# Patient Record
Sex: Female | Born: 1990
Health system: Southern US, Community
[De-identification: ages and names within clinical notes are randomized; demographics above are authoritative.]

## PROBLEM LIST (undated history)

## (undated) DIAGNOSIS — A749 Chlamydial infection, unspecified: Secondary | ICD-10-CM

## (undated) DIAGNOSIS — N390 Urinary tract infection, site not specified: Secondary | ICD-10-CM

## (undated) DIAGNOSIS — Z114 Encounter for screening for human immunodeficiency virus [HIV]: Secondary | ICD-10-CM

## (undated) DIAGNOSIS — Z22330 Carrier of Group B streptococcus: Secondary | ICD-10-CM

## (undated) HISTORY — DX: Encounter for screening for human immunodeficiency virus (HIV): Z11.4

---
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
## 2008-06-26 ENCOUNTER — Encounter: Admission: RE | Admit: 2008-06-26 | Discharge: 2008-06-26 | Payer: Self-pay | Admitting: Surgery

## 2009-06-18 ENCOUNTER — Emergency Department (HOSPITAL_COMMUNITY): Admission: EM | Admit: 2009-06-18 | Discharge: 2009-06-18 | Payer: Self-pay | Admitting: Family Medicine

## 2010-05-13 ENCOUNTER — Emergency Department (HOSPITAL_COMMUNITY): Admission: EM | Admit: 2010-05-13 | Discharge: 2010-05-13 | Payer: Self-pay | Admitting: Family Medicine

## 2010-05-28 ENCOUNTER — Emergency Department (HOSPITAL_COMMUNITY): Admission: EM | Admit: 2010-05-28 | Discharge: 2010-05-29 | Payer: Self-pay | Admitting: Emergency Medicine

## 2010-05-29 ENCOUNTER — Emergency Department (HOSPITAL_COMMUNITY): Admission: EM | Admit: 2010-05-29 | Discharge: 2010-05-29 | Payer: Self-pay | Admitting: Emergency Medicine

## 2010-08-23 ENCOUNTER — Emergency Department (HOSPITAL_COMMUNITY)
Admission: EM | Admit: 2010-08-23 | Discharge: 2010-08-23 | Payer: Self-pay | Source: Home / Self Care | Admitting: Emergency Medicine

## 2010-10-05 NOTE — L&D Delivery Note (Signed)
Delivery Note  After moved to hands and knees, pt had stronger urge to push, pushing involuntarily, turned back to [facility] and pushed well.   At 8:24 PM a viable female was delivered via Vaginal, Spontaneous Delivery (Presentation: Right Occiput Anterior). Compound presentation of R arm  APGAR: 9, 9; weight 7 lb 7.4 oz (3385 g).   Placenta status: Intact, Spontaneous.  Cord: 3 vessels with the following complications: None.    Anesthesia: 2% lidocaine, local infiltration Episiotomy: None Lacerations: 1st degree;Labial;Perineal Suture Repair: 3.0 vicryl rapide Est. Blood Loss (mL): 250  Mom to postpartum.  Baby to nursery-stable. Pt desires IP circ Dr. Normand Sloop updated  Malissa Hippo 08/23/2011, 9:18 PM

## 2010-10-27 ENCOUNTER — Encounter: Payer: Self-pay | Admitting: Surgery

## 2010-12-19 LAB — POCT URINALYSIS DIPSTICK
Glucose, UA: NEGATIVE mg/dL
Protein, ur: 30 mg/dL — AB
Urobilinogen, UA: 0.2 mg/dL (ref 0.0–1.0)

## 2010-12-19 LAB — POCT PREGNANCY, URINE: Preg Test, Ur: NEGATIVE

## 2010-12-19 LAB — URINE CULTURE: Colony Count: 85000

## 2011-03-30 LAB — CBC: Platelets: 196 10*3/uL (ref 150–399)

## 2011-03-30 LAB — ABO/RH

## 2011-03-30 LAB — HIV ANTIBODY (ROUTINE TESTING W REFLEX): HIV: NONREACTIVE

## 2011-03-30 LAB — RPR: RPR: NONREACTIVE

## 2011-04-17 ENCOUNTER — Institutional Professional Consult (permissible substitution): Payer: Self-pay | Admitting: Pulmonary Disease

## 2011-04-20 ENCOUNTER — Institutional Professional Consult (permissible substitution): Payer: Self-pay | Admitting: Pulmonary Disease

## 2011-06-09 LAB — GC/CHLAMYDIA PROBE AMP, GENITAL
Chlamydia: POSITIVE
Gonorrhea: NEGATIVE

## 2011-06-09 LAB — CBC
HCT: 36 % (ref 36–46)
Hemoglobin: 11.7 g/dL — AB (ref 12.0–16.0)

## 2011-07-27 ENCOUNTER — Inpatient Hospital Stay (HOSPITAL_COMMUNITY)
Admission: AD | Admit: 2011-07-27 | Discharge: 2011-07-27 | Disposition: A | Discharge status: Home / Self Care | Payer: PRIVATE HEALTH INSURANCE | Source: Ambulatory Visit | Attending: Obstetrics and Gynecology | Admitting: Obstetrics and Gynecology

## 2011-07-27 ENCOUNTER — Encounter (HOSPITAL_COMMUNITY): Payer: Self-pay | Admitting: *Deleted

## 2011-07-27 DIAGNOSIS — O99891 Other specified diseases and conditions complicating pregnancy: Secondary | ICD-10-CM | POA: Insufficient documentation

## 2011-07-27 HISTORY — DX: Chlamydial infection, unspecified: A74.9

## 2011-07-27 LAB — URINALYSIS, ROUTINE W REFLEX MICROSCOPIC
Bilirubin Urine: NEGATIVE
Leukocytes, UA: NEGATIVE
Protein, ur: NEGATIVE mg/dL
Specific Gravity, Urine: 1.015 (ref 1.005–1.030)
Urobilinogen, UA: 0.2 mg/dL (ref 0.0–1.0)

## 2011-07-27 LAB — AMNISURE RUPTURE OF MEMBRANE (ROM) NOT AT ARMC: Amnisure ROM: NEGATIVE

## 2011-07-27 NOTE — Progress Notes (Signed)
Gush around 1700 on Sunday, "hot" fluid, clear.  Still having a small amt coming. No bleeding.  Had some pain after gush.  Small little ctx's today.

## 2011-07-27 NOTE — ED Provider Notes (Signed)
History     Chief Complaint  Patient presents with  . Labor Eval   HPI Comments: Pt is a G1P0 at 35wks w c/o leaking fluid since last evening, has not had much leaking today. Denies ctx, VB, +FM.       Past Medical History  Diagnosis Date  . Asthma   . Chlamydia     Past Surgical History  Procedure Date  . Breast lumpectomy     Family History  Problem Relation Age of Onset  . Cancer Mother   . Heart disease Mother   . Hypertension Mother     History  Substance Use Topics  . Smoking status: Never Smoker   . Smokeless tobacco: Not on file  . Alcohol Use: No    Allergies: No Known Allergies  Prescriptions prior to admission  Medication Sig Dispense Refill  . albuterol (PROVENTIL HFA;VENTOLIN HFA) 108 (90 BASE) MCG/ACT inhaler Inhale 2 puffs into the lungs daily as needed. For asthma         Review of Systems  All other systems reviewed and are negative.   Physical Exam   Blood pressure 89/54, pulse 69, temperature 98.7 F (37.1 C), temperature source Oral, resp. rate 18, height 5\' 5"  (1.651 m), weight 67.132 kg (148 lb).  Physical Exam  Nursing note and vitals reviewed. Constitutional: She is oriented to person, place, and time. She appears well-developed and well-nourished.  Cardiovascular: Normal rate.   Respiratory: Effort normal.  GI: Soft.  Genitourinary: Vagina normal.       Cx=ft/th/high Scant discharge in vault amnisure collected  Neurological: She is alert and oriented to person, place, and time.  Skin: Skin is warm.  Psychiatric: She has a normal mood and affect. Her behavior is normal.   FHR 130 CAT I toco - occ uc with irritability  MAU Course  Procedures    Assessment and Plan  IUP at 35wks FHR reassuring  amnisure pending  Kamaiyah Uselton M 07/27/2011, 2:02 PM   @1425  - amnisure negative Pt d/c home  Has f/u on 10-24

## 2011-08-22 ENCOUNTER — Encounter (HOSPITAL_COMMUNITY): Payer: Self-pay | Admitting: *Deleted

## 2011-08-22 ENCOUNTER — Inpatient Hospital Stay (HOSPITAL_COMMUNITY)
Admission: AD | Admit: 2011-08-22 | Discharge: 2011-08-22 | Disposition: A | Discharge status: Home / Self Care | Payer: Medicaid Other | Source: Ambulatory Visit | Attending: Obstetrics and Gynecology | Admitting: Obstetrics and Gynecology

## 2011-08-22 DIAGNOSIS — O471 False labor at or after 37 completed weeks of gestation: Secondary | ICD-10-CM

## 2011-08-22 DIAGNOSIS — O479 False labor, unspecified: Secondary | ICD-10-CM | POA: Insufficient documentation

## 2011-08-22 HISTORY — DX: Urinary tract infection, site not specified: N39.0

## 2011-08-22 NOTE — ED Notes (Signed)
efm strip was reviewed by Elsie Ra CNM reactive

## 2011-08-22 NOTE — ED Provider Notes (Signed)
History     Chief Complaint  Patient presents with  . Contractions   HPI Pt presents with complaint of uterine contractions throughout the night which she perceives to have increased in intensity and frequency over the past several hours.  She denies ROM or bldg.  She reports her fetus is moving normally.   OB History    Grav Para Term Preterm Abortions TAB SAB Ect Mult Living   1               Past Medical History  Diagnosis Date  . Asthma   . Chlamydia   . Urinary tract infection, recurrent     Past Surgical History  Procedure Date  . Breast lumpectomy     Family History  Problem Relation Age of Onset  . Cancer Mother   . Heart disease Mother   . Hypertension Mother   . Mental illness Mother   . Hypertension Father   . Hypertension Maternal Aunt   . Hypertension Maternal Uncle   . Hypertension Maternal Grandmother   . Kidney disease Maternal Grandmother   . COPD Maternal Grandmother   . Cancer Maternal Grandmother   . Hypertension Maternal Grandfather     History  Substance Use Topics  . Smoking status: Never Smoker   . Smokeless tobacco: Not on file  . Alcohol Use: No    Allergies: No Known Allergies  Prescriptions prior to admission  Medication Sig Dispense Refill  . albuterol (PROVENTIL HFA;VENTOLIN HFA) 108 (90 BASE) MCG/ACT inhaler Inhale 2 puffs into the lungs daily as needed. For asthma        Review of Systems  Constitutional: Negative.   HENT: Negative.   Eyes: Negative.   Respiratory: Negative.   Cardiovascular: Negative.   Gastrointestinal: Negative.   Genitourinary: Negative.   Musculoskeletal: Negative.   Skin: Negative.   Neurological: Negative.   Endo/Heme/Allergies: Negative.   Psychiatric/Behavioral: Negative.    Physical Exam   Blood pressure 113/66, pulse 77, temperature 98.1 F (36.7 C), temperature source Oral, resp. rate 18.  Physical Exam  Constitutional: She is oriented to person, place, and time. She appears  well-developed and well-nourished.  HENT:  Head: Normocephalic and atraumatic.  Right Ear: External ear normal.  Left Ear: External ear normal.  Nose: Nose normal.  Eyes: Pupils are equal, round, and reactive to light.  Neck: Normal range of motion. Neck supple. No thyromegaly present.  Cardiovascular: Normal rate, regular rhythm and intact distal pulses.   Respiratory: Effort normal and breath sounds normal.  GI: Soft. Bowel sounds are normal.  Genitourinary: Vagina normal and uterus normal.  Musculoskeletal: Normal range of motion.  Neurological: She is alert and oriented to person, place, and time. She has normal reflexes.  Skin: Skin is warm and dry.  Psychiatric: She has a normal mood and affect. Her behavior is normal. Judgment and thought content normal.   SVE:  Dilation 1.5cm Effacement:  80% Station: -1 Presentation:  Vertex  FHR baseline 130 with moderate variability present.  No decels present.  Accels present.   UC's every 4-7 minutes with uterine irritability present.  MAU Course  Procedures   Assessment and Plan  IUP at 38w 5d Contractions  Pt to ambulate x 1hr and then reevaluate SVE.  Kenlie Seki O. 08/22/2011, 8:01 AM   08/22/11, 9:37 AM Pt returned from walking and reports since returning to MAU her contractions are much less frequent.    FHR baseline 130 with minimal to moderate variability.  No  decels present.  Accels present.   UCs every 7-11 minutes   SVE - unchanged from previous.  A:  False labor at 38w 5 days  P:  Discharge to home.  RTO as previously scheduled for ROB.  Revd signs/symptoms of labor.  Elsie Ra, CNM

## 2011-08-22 NOTE — Progress Notes (Signed)
Been having since Friday at 7p.  Contractions got worse and closer together

## 2011-08-23 ENCOUNTER — Encounter (HOSPITAL_COMMUNITY): Payer: Self-pay | Admitting: *Deleted

## 2011-08-23 ENCOUNTER — Inpatient Hospital Stay (HOSPITAL_COMMUNITY)
Admission: AD | Admit: 2011-08-23 | Discharge: 2011-08-25 | DRG: 775 | Disposition: A | Discharge status: Home / Self Care | Payer: Medicaid Other | Source: Ambulatory Visit | Attending: Obstetrics and Gynecology | Admitting: Obstetrics and Gynecology

## 2011-08-23 DIAGNOSIS — J4540 Moderate persistent asthma, uncomplicated: Secondary | ICD-10-CM | POA: Diagnosis present

## 2011-08-23 DIAGNOSIS — Z22330 Carrier of Group B streptococcus: Secondary | ICD-10-CM

## 2011-08-23 DIAGNOSIS — Z3490 Encounter for supervision of normal pregnancy, unspecified, unspecified trimester: Secondary | ICD-10-CM

## 2011-08-23 DIAGNOSIS — O328XX0 Maternal care for other malpresentation of fetus, not applicable or unspecified: Secondary | ICD-10-CM | POA: Diagnosis present

## 2011-08-23 DIAGNOSIS — A749 Chlamydial infection, unspecified: Secondary | ICD-10-CM | POA: Diagnosis not present

## 2011-08-23 DIAGNOSIS — O99892 Other specified diseases and conditions complicating childbirth: Secondary | ICD-10-CM | POA: Diagnosis present

## 2011-08-23 HISTORY — DX: Carrier of group B Streptococcus: Z22.330

## 2011-08-23 LAB — CBC
HCT: 39.4 % (ref 36.0–46.0)
Hemoglobin: 13.3 g/dL (ref 12.0–15.0)
WBC: 7.8 10*3/uL (ref 4.0–10.5)

## 2011-08-23 MED ORDER — ONDANSETRON HCL 4 MG/2ML IJ SOLN: 4.0000 mg | INTRAMUSCULAR | Status: DC | PRN

## 2011-08-23 MED ORDER — BENZOCAINE-MENTHOL 20-0.5 % EX AERO: 1.0000 "application " | INHALATION_SPRAY | CUTANEOUS | Status: DC | PRN

## 2011-08-23 MED ORDER — PENICILLIN G POTASSIUM 5000000 UNITS IJ SOLR
5.0000 10*6.[IU] | Freq: Once | INTRAVENOUS | Status: AC
  Administered 2011-08-23: 5 10*6.[IU] via INTRAVENOUS
  Filled 2011-08-23: qty 5

## 2011-08-23 MED ORDER — ACETAMINOPHEN 325 MG PO TABS: 650.0000 mg | ORAL_TABLET | ORAL | Status: DC | PRN

## 2011-08-23 MED ORDER — SIMETHICONE 80 MG PO CHEW: 80.0000 mg | CHEWABLE_TABLET | ORAL | Status: DC | PRN

## 2011-08-23 MED ORDER — LANOLIN HYDROUS EX OINT: TOPICAL_OINTMENT | CUTANEOUS | Status: DC | PRN

## 2011-08-23 MED ORDER — ZOLPIDEM TARTRATE 5 MG PO TABS: 5.0000 mg | ORAL_TABLET | Freq: Every evening | ORAL | Status: DC | PRN

## 2011-08-23 MED ORDER — LIDOCAINE HCL (PF) 1 % IJ SOLN
30.0000 mL | INTRAMUSCULAR | Status: DC | PRN
  Administered 2011-08-23: 30 mL via SUBCUTANEOUS
  Filled 2011-08-23: qty 30

## 2011-08-23 MED ORDER — PRENATAL PLUS 27-1 MG PO TABS
1.0000 | ORAL_TABLET | Freq: Every day | ORAL | Status: DC
  Administered 2011-08-24 – 2011-08-25 (×2): 1 via ORAL
  Filled 2011-08-23 (×2): qty 1

## 2011-08-23 MED ORDER — OXYTOCIN 10 UNIT/ML IJ SOLN: 10.0000 [IU] | Freq: Once | INTRAMUSCULAR | Status: DC

## 2011-08-23 MED ORDER — TETANUS-DIPHTH-ACELL PERTUSSIS 5-2.5-18.5 LF-MCG/0.5 IM SUSP
0.5000 mL | Freq: Once | INTRAMUSCULAR | Status: AC
  Administered 2011-08-24: 0.5 mL via INTRAMUSCULAR
  Filled 2011-08-23: qty 0.5

## 2011-08-23 MED ORDER — OXYTOCIN 20 UNITS IN LACTATED RINGERS INFUSION - SIMPLE
125.0000 mL/h | Freq: Once | INTRAVENOUS | Status: AC
  Administered 2011-08-23: 125 mL/h via INTRAVENOUS

## 2011-08-23 MED ORDER — BENZOCAINE-MENTHOL 20-0.5 % EX AERO
INHALATION_SPRAY | CUTANEOUS | Status: AC
  Filled 2011-08-23: qty 56

## 2011-08-23 MED ORDER — IBUPROFEN 600 MG PO TABS
600.0000 mg | ORAL_TABLET | Freq: Four times a day (QID) | ORAL | Status: DC
  Administered 2011-08-24 – 2011-08-25 (×5): 600 mg via ORAL
  Filled 2011-08-23 (×6): qty 1

## 2011-08-23 MED ORDER — SENNOSIDES-DOCUSATE SODIUM 8.6-50 MG PO TABS
2.0000 | ORAL_TABLET | Freq: Every day | ORAL | Status: DC
  Administered 2011-08-24: 2 via ORAL

## 2011-08-23 MED ORDER — ONDANSETRON HCL 4 MG PO TABS: 4.0000 mg | ORAL_TABLET | ORAL | Status: DC | PRN

## 2011-08-23 MED ORDER — LACTATED RINGERS IV SOLN
INTRAVENOUS | Status: DC
  Administered 2011-08-23: 125 mL/h via INTRAVENOUS

## 2011-08-23 MED ORDER — DIBUCAINE 1 % RE OINT: 1.0000 "application " | TOPICAL_OINTMENT | RECTAL | Status: DC | PRN

## 2011-08-23 MED ORDER — OXYTOCIN BOLUS FROM INFUSION
500.0000 mL | Freq: Once | INTRAVENOUS | Status: DC
  Filled 2011-08-23: qty 1000
  Filled 2011-08-23: qty 500

## 2011-08-23 MED ORDER — OXYCODONE-ACETAMINOPHEN 5-325 MG PO TABS: 1.0000 | ORAL_TABLET | ORAL | Status: DC | PRN

## 2011-08-23 MED ORDER — BUTORPHANOL TARTRATE 2 MG/ML IJ SOLN
INTRAMUSCULAR | Status: AC
  Administered 2011-08-23: 2 mg via INTRAVENOUS
  Filled 2011-08-23: qty 1

## 2011-08-23 MED ORDER — IBUPROFEN 600 MG PO TABS: 600.0000 mg | ORAL_TABLET | Freq: Four times a day (QID) | ORAL | Status: DC | PRN

## 2011-08-23 MED ORDER — OXYCODONE-ACETAMINOPHEN 5-325 MG PO TABS: 2.0000 | ORAL_TABLET | ORAL | Status: DC | PRN

## 2011-08-23 MED ORDER — BUTORPHANOL TARTRATE 2 MG/ML IJ SOLN
2.0000 mg | INTRAMUSCULAR | Status: DC | PRN
  Administered 2011-08-23: 2 mg via INTRAVENOUS

## 2011-08-23 MED ORDER — LACTATED RINGERS IV SOLN: 500.0000 mL | INTRAVENOUS | Status: DC | PRN

## 2011-08-23 MED ORDER — CITRIC ACID-SODIUM CITRATE 334-500 MG/5ML PO SOLN: 30.0000 mL | ORAL | Status: DC | PRN

## 2011-08-23 MED ORDER — ONDANSETRON HCL 4 MG/2ML IJ SOLN: 4.0000 mg | Freq: Four times a day (QID) | INTRAMUSCULAR | Status: DC | PRN

## 2011-08-23 MED ORDER — WITCH HAZEL-GLYCERIN EX PADS: 1.0000 "application " | MEDICATED_PAD | CUTANEOUS | Status: DC | PRN

## 2011-08-23 MED ORDER — DIPHENHYDRAMINE HCL 25 MG PO CAPS: 25.0000 mg | ORAL_CAPSULE | Freq: Four times a day (QID) | ORAL | Status: DC | PRN

## 2011-08-23 MED ORDER — PENICILLIN G POTASSIUM 5000000 UNITS IJ SOLR
2.5000 10*6.[IU] | INTRAVENOUS | Status: DC
  Administered 2011-08-23 (×2): 2.5 10*6.[IU] via INTRAVENOUS
  Filled 2011-08-23 (×4): qty 2.5

## 2011-08-23 NOTE — Progress Notes (Signed)
.  Subjective: Contractions stronger, requests IV mediation    Objective: BP 113/54  Pulse 89  Temp(Src) 98.9 F (37.2 C) (Oral)  Resp 18  Ht 5\' 5"  (1.651 m)  Wt 68.04 kg (150 lb)  BMI 24.96 kg/m2   FHT:  145 UC:   regular,  SVE:   Dilation: 8 Effacement (%): 100 Station: +1 Exam by:: S.Celine Dishman,CNM    Assessment / Plan: Spontaneous labor, progressing normally GBS +   Fetal Wellbeing:  reassuring Pain Control:  will give stadol after EFM strip  Update Dr. Normand Sloop per telephone on vag, arm. Discussed pain management options IV vs epidural, requests IV meds.  Maytal Mijangos M 08/23/2011, 5:58 PM

## 2011-08-23 NOTE — H&P (Signed)
Terri Howard is a 20 y.o. female presenting for onset of ctx since last night, stronger this morning. Some bloody show, no LOF +FM. Pregnancy significant for: 1. +GBS 2. Hx CT - just finished zithromax last week 3. Hx UTI 4. Strong FH HTN  Maternal Medical History:  Reason for admission: Reason for admission: contractions.  Contractions: Onset was 6-12 hours ago.   Frequency: regular.   Perceived severity is moderate.    Fetal activity: Perceived fetal activity is normal.   Last perceived fetal movement was within the past hour.    Prenatal complications: no prenatal complications   OB History    Grav Para Term Preterm Abortions TAB SAB Ect Mult Living   1              Past Medical History  Diagnosis Date  . Asthma   . Chlamydia   . Urinary tract infection, recurrent    Past Surgical History  Procedure Date  . Breast lumpectomy    Family History: family history includes COPD in her maternal grandmother; Cancer in her maternal grandmother and mother; Heart disease in her mother; Hypertension in her father, maternal aunt, maternal grandfather, maternal grandmother, maternal uncle, and mother; Kidney disease in her maternal grandmother; and Mental illness in her mother. Social History:  reports that she has never smoked. She does not have any smokeless tobacco history on file. She reports that she does not drink alcohol or use illicit drugs.  Pt is a single AA, is a Company secretary  Review of Systems  All other systems reviewed and are negative.    Dilation: 5 Effacement (%): 100 Station: +1 Exam by:: [email or web address] Temperature 98.3 F (36.8 C), temperature source Oral, resp. rate 18, height 5\' 5"  (1.651 Howard), weight 68.04 kg (150 lb). Maternal Exam:  Uterine Assessment: Contraction strength is moderate.  Contraction frequency is regular.   Abdomen: Fundal height is aga.   Estimated fetal weight is 7#.   Fetal presentation: vertex  Introitus: Normal vulva.  Normal vagina.  Ferning test: not done.   Pelvis: adequate for delivery.   Cervix: Cervix evaluated by digital exam.   cx 5cm/100/0 per RN  Fetal Exam Fetal Monitor Review: Mode: ultrasound.   Baseline rate: 140.  Variability: moderate (6-25 bpm).   Pattern: no accelerations.    Fetal State Assessment: Category I - tracings are normal.     Physical Exam  Nursing note and vitals reviewed. Constitutional: She is oriented to person, place, and time. She appears well-developed and well-nourished.  HENT:  Head: Normocephalic.  Neck: Normal range of motion.  Cardiovascular: Normal rate, regular rhythm and normal heart sounds.   Respiratory: Effort normal and breath sounds normal.  GI: There is no tenderness.  Genitourinary: Vagina normal.  Musculoskeletal: Normal range of motion.  Neurological: She is alert and oriented to person, place, and time.  Skin: Skin is warm and dry.  Psychiatric: She has a normal mood and affect. Her behavior is normal.    Prenatal labs: ABO, Rh: O/Positive/-- (06/25 0000) Antibody: Negative (06/25 0000) Rubella:   IMM      RPR: Nonreactive (06/25 0000)  HBsAg:   neg HIV: Non-reactive (06/25 0000)  GBS: Positive (09/04 0000)  QUAD screen neg Hgb electro - neg 1hr GTT normal   Assessment/Plan: IUP at [redacted]w[redacted]d +GBS FHR reassuring, but not reactive Active labor  Admit to birthing suites, CNM care, Dr Normand Sloop attending PCN for GBS prophylaxis Declines pain meds PRN Expectant mgmnt  Terri Howard 08/23/2011, 11:56 AM

## 2011-08-23 NOTE — Progress Notes (Signed)
.  Subjective: Back and pelvic pressure with contractions plans to walk    Objective: BP 111/59  Pulse 92  Temp(Src) 98.3 F (36.8 C) (Oral)  Resp 18  Ht 5\' 5"  (1.651 m)  Wt 68.04 kg (150 lb)  BMI 24.96 kg/m2   FHT:  FHR: 140s intermittentlySVE:  ctxn mod, regular abd soft between uc Dilation: 7 Effacement (%): 100 Station: +1 Exam by:: Almond Lint CNM    Assessment / Plan: Spontaneous labor, progressing normally GBS    Fetal Wellbeing:  reassuring Pain Control:  ambulating  Update physician PRN  Malissa Hippo 08/23/2011, 2:34 PM

## 2011-08-24 LAB — CBC
Hemoglobin: 12.2 g/dL (ref 12.0–15.0)
MCH: 29.5 pg (ref 26.0–34.0)
MCHC: 33.1 g/dL (ref 30.0–36.0)
MCV: 89.1 fL (ref 78.0–100.0)
RBC: 4.14 MIL/uL (ref 3.87–5.11)

## 2011-08-24 NOTE — Progress Notes (Addendum)
Post Partum Day 1  Subjective: up ad lib, voiding, tolerating PO, + flatus and reports some dizziness when up today.  Working on breastfeeding; Desires outpatient circ.  VB lightening.    Objective: Blood pressure 92/54, pulse 72, temperature 98.5 F (36.9 C), temperature source Oral, resp. rate 20, height 5\' 5"  (1.651 m), weight 68.04 kg (150 lb), SpO2 96.00%, unknown if currently breastfeeding.  Physical Exam:  General: alert, cooperative and no distress Lochia: appropriate Uterine Fundus: firm, below umbilicus Incision: n/a DVT Evaluation: No evidence of DVT seen on physical exam. Negative Homan's sign. No significant calf/ankle edema.   Basename 08/24/11 0515 08/23/11 1130  HGB 12.2 13.3  HCT 36.9 39.4    Assessment/Plan: Plan for discharge tomorrow, Breastfeeding and Lactation consult Will do orthostatic VS secondary to dizziness.  LOS: 1 day   STEELMAN,CANDICE H 08/24/2011, 3:05 PM    Agree with Above - AYR

## 2011-08-24 NOTE — Progress Notes (Signed)
UR chart review completed.  

## 2011-08-25 NOTE — Progress Notes (Signed)
Post Partum Day 2 Subjective: no complaints, up ad lib, voiding, tolerating PO, + flatus and breastfeeding going well, leaking.  Plans outpatient circ.  Undecided on [facility].  No BM yet.  VB tapering. Pt's mother at [facility] this AM. Minimal pain to report.  Objective: Blood pressure 92/46, pulse 74, temperature 98.2 F (36.8 C), temperature source Oral, resp. rate 18, height 5\' 5"  (1.651 m), weight 68.04 kg (150 lb), SpO2 96.00%, unknown if currently breastfeeding.  Physical Exam:  General: alert, cooperative and no distress Lochia: appropriate Uterine Fundus: below umbilicus Incision: n/a DVT Evaluation: No evidence of DVT seen on physical exam. Negative Homan's sign. No edema   Basename 08/24/11 0515 08/23/11 1130  HGB 12.2 13.3  HCT 36.9 39.4    Assessment/Plan: Discharge home, Breastfeeding and Contraception Undecided--abstinence recommended until 6 wk appt. D/c instructions per [facility] pamphlet; warning s/s to report rev'd; continue PNV; Rx given for motrin.   LOS: 2 days   Terri Howard H 08/25/2011, 10:32 AM

## 2011-08-25 NOTE — Discharge Summary (Signed)
Obstetric Discharge Summary Reason for Admission: onset of labor Prenatal Procedures: ultrasound Intrapartum Procedures: spontaneous vaginal delivery and GBS prophylaxis Postpartum Procedures: none Complications-Operative and Postpartum: 1st  degree perineal laceration & labial Hemoglobin  Date Value Range Status  08/24/2011 12.2  12.0-15.0 (g/dL) Final     HCT  Date Value Range Status  08/24/2011 36.9  36.0-46.0 (%) Final    Discharge Diagnoses: Term Pregnancy-delivered and lactating; asthma-stable; h/o recurrent UTI's; h/o STI's--chlamydia positive late 3rd trimester;  Discharge Information: Date: 08/25/2011 Activity: pelvic rest Diet: routine Medications: PNV, Ibuprofen and Colace Condition: stable Instructions: refer to practice specific booklet Discharge to: home Follow-up Information    Follow up with [facility] in 6 weeks. (call to schedule your appointment, or  as needed)          Newborn Data: Live born female "Demetris" Sanda Klein, [place] delivery provider) Birth Weight: 7 lb 7.4 oz (3385 g) APGAR: 9, 9  Home with mother.  Aasim Restivo H 08/25/2011, 10:29 AM

## 2012-07-29 ENCOUNTER — Encounter (HOSPITAL_COMMUNITY): Payer: Self-pay | Admitting: Emergency Medicine

## 2012-07-29 ENCOUNTER — Emergency Department (HOSPITAL_COMMUNITY)
Admission: EM | Admit: 2012-07-29 | Discharge: 2012-07-29 | Disposition: A | Discharge status: Home / Self Care | Payer: BC Managed Care – PPO | Source: Home / Self Care | Attending: Family Medicine | Admitting: Family Medicine

## 2012-07-29 ENCOUNTER — Emergency Department (HOSPITAL_COMMUNITY)
Admission: EM | Admit: 2012-07-29 | Discharge: 2012-07-29 | Disposition: A | Discharge status: Home / Self Care | Payer: BC Managed Care – PPO | Attending: Emergency Medicine | Admitting: Emergency Medicine

## 2012-07-29 ENCOUNTER — Encounter (HOSPITAL_COMMUNITY): Payer: Self-pay | Admitting: *Deleted

## 2012-07-29 DIAGNOSIS — Z87898 Personal history of other specified conditions: Secondary | ICD-10-CM | POA: Insufficient documentation

## 2012-07-29 DIAGNOSIS — R062 Wheezing: Secondary | ICD-10-CM

## 2012-07-29 DIAGNOSIS — N390 Urinary tract infection, site not specified: Secondary | ICD-10-CM | POA: Insufficient documentation

## 2012-07-29 DIAGNOSIS — J069 Acute upper respiratory infection, unspecified: Secondary | ICD-10-CM | POA: Insufficient documentation

## 2012-07-29 DIAGNOSIS — R05 Cough: Secondary | ICD-10-CM

## 2012-07-29 DIAGNOSIS — J45901 Unspecified asthma with (acute) exacerbation: Secondary | ICD-10-CM

## 2012-07-29 DIAGNOSIS — J45909 Unspecified asthma, uncomplicated: Secondary | ICD-10-CM | POA: Insufficient documentation

## 2012-07-29 DIAGNOSIS — Z79899 Other long term (current) drug therapy: Secondary | ICD-10-CM | POA: Insufficient documentation

## 2012-07-29 DIAGNOSIS — IMO0002 Reserved for concepts with insufficient information to code with codable children: Secondary | ICD-10-CM | POA: Insufficient documentation

## 2012-07-29 MED ORDER — FLUTICASONE-SALMETEROL 250-50 MCG/DOSE IN AEPB: 1.0000 | INHALATION_SPRAY | Freq: Two times a day (BID) | RESPIRATORY_TRACT | Status: DC

## 2012-07-29 MED ORDER — METHYLPREDNISOLONE SODIUM SUCC 125 MG IJ SOLR
125.0000 mg | Freq: Once | INTRAMUSCULAR | Status: AC
  Administered 2012-07-29: 125 mg via INTRAMUSCULAR

## 2012-07-29 MED ORDER — ALBUTEROL SULFATE (5 MG/ML) 0.5% IN NEBU
5.0000 mg | INHALATION_SOLUTION | Freq: Once | RESPIRATORY_TRACT | Status: AC
  Administered 2012-07-29: 5 mg via RESPIRATORY_TRACT

## 2012-07-29 MED ORDER — IPRATROPIUM BROMIDE 0.02 % IN SOLN
0.5000 mg | Freq: Once | RESPIRATORY_TRACT | Status: AC
  Administered 2012-07-29: 0.5 mg via RESPIRATORY_TRACT

## 2012-07-29 MED ORDER — CETIRIZINE HCL 10 MG PO CHEW: 10.0000 mg | CHEWABLE_TABLET | Freq: Every day | ORAL | Status: DC

## 2012-07-29 MED ORDER — METHYLPREDNISOLONE SODIUM SUCC 125 MG IJ SOLR
INTRAMUSCULAR | Status: AC
  Filled 2012-07-29: qty 2

## 2012-07-29 MED ORDER — ALBUTEROL SULFATE HFA 108 (90 BASE) MCG/ACT IN AERS: 2.0000 | INHALATION_SPRAY | Freq: Every day | RESPIRATORY_TRACT | Status: DC | PRN

## 2012-07-29 MED ORDER — ALBUTEROL SULFATE (5 MG/ML) 0.5% IN NEBU
2.5000 mg | INHALATION_SOLUTION | Freq: Once | RESPIRATORY_TRACT | Status: AC
  Administered 2012-07-29: 2.5 mg via RESPIRATORY_TRACT
  Filled 2012-07-29: qty 0.5

## 2012-07-29 MED ORDER — ALBUTEROL SULFATE (5 MG/ML) 0.5% IN NEBU
INHALATION_SOLUTION | RESPIRATORY_TRACT | Status: AC
  Filled 2012-07-29: qty 1

## 2012-07-29 MED ORDER — PREDNISONE 20 MG PO TABS: 40.0000 mg | ORAL_TABLET | Freq: Every day | ORAL | Status: DC

## 2012-07-29 NOTE — ED Notes (Signed)
Pt  Seen  Last  Pm  With  Asthma     - she  Was  Given   Neb tx    She   Reports  Having  Congestion  /  Cough  Over  Last  Week      She  Continues  To  Have    Shortness  Of  Breath  She  Took a  Neb  Today this  Am

## 2012-07-29 NOTE — ED Notes (Signed)
Still  Having  Some    Wheezing        Pulse  Ox  89          carman  Back  In to  evaul

## 2012-07-29 NOTE — ED Provider Notes (Signed)
Medical screening examination/treatment/procedure(s) were performed by non-physician practitioner and as supervising physician I was immediately available for consultation/collaboration.   Julie Manly, MD 07/29/12 0609 

## 2012-07-29 NOTE — ED Provider Notes (Signed)
History     CSN: [id number]  Arrival date & time 07/29/12  0216   First MD Initiated Contact with Patient 07/29/12 [phone]      Chief Complaint  Patient presents with  . Shortness of Breath    (Consider location/radiation/quality/duration/timing/severity/associated sxs/prior treatment) HPI Comments: Patient states she has a history of, asthma.  She uses albuterol inhaler, as well as albuterol, nebulizer has had URI symptoms for the past 3, days, for, which she's been taking over-the-counter Delsym and DayQuil.  Still suffering from nasal congestion, which he, feels is exacerbating her asthma.  Tonight.  She use her albuterol inhaler 3 times  Patient is a 21 y.o. female presenting with shortness of breath. The history is provided by the patient.  Shortness of Breath  The current episode started 2 days ago. The problem has been unchanged. The problem is mild. Nothing relieves the symptoms. Associated symptoms include rhinorrhea, cough and shortness of breath. Pertinent negatives include no fever, no sore throat and no wheezing.    Past Medical History  Diagnosis Date  . Asthma   . Chlamydia   . Urinary tract infection, recurrent     Past Surgical History  Procedure Date  . Breast lumpectomy     Family History  Problem Relation Age of Onset  . Cancer Mother   . Heart disease Mother   . Hypertension Mother   . Mental illness Mother   . Hypertension Father   . Hypertension Maternal Aunt   . Hypertension Maternal Uncle   . Hypertension Maternal Grandmother   . Kidney disease Maternal Grandmother   . COPD Maternal Grandmother   . Cancer Maternal Grandmother   . Hypertension Maternal Grandfather     History  Substance Use Topics  . Smoking status: Never Smoker   . Smokeless tobacco: Not on file  . Alcohol Use: No    OB History    Grav Para Term Preterm Abortions TAB SAB Ect Mult Living   1 1 1       1       Review of Systems  Constitutional: Negative for fever.    HENT: Positive for rhinorrhea. Negative for sore throat.   Respiratory: Positive for cough and shortness of breath. Negative for wheezing.   Gastrointestinal: Negative for nausea.  Neurological: Negative for dizziness, weakness and headaches.    Allergies  Review of patient's allergies indicates no known allergies.  Home Medications   Current Outpatient Rx  Name Route Sig Dispense Refill  . ALBUTEROL SULFATE HFA 108 (90 BASE) MCG/ACT IN AERS Inhalation Inhale 2 puffs into the lungs daily as needed. For asthma    . FLUTICASONE-SALMETEROL 250-50 MCG/DOSE IN AEPB Inhalation Inhale 1 puff into the lungs every 12 (twelve) hours.      BP 120/84  Pulse 87  Temp 98.5 F (36.9 C) (Oral)  Resp 18  SpO2 95%  Physical Exam  Constitutional: She appears well-developed and well-nourished.  HENT:  Head: Normocephalic.  Nose: Mucosal edema and rhinorrhea present. No nose lacerations, nasal deformity or septal deviation. Right sinus exhibits no maxillary sinus tenderness and no frontal sinus tenderness. Left sinus exhibits no maxillary sinus tenderness and no frontal sinus tenderness.  Eyes: Pupils are equal, round, and reactive to light.  Neck: Normal range of motion.  Cardiovascular: Normal rate.   Pulmonary/Chest: Effort normal and breath sounds normal. No respiratory distress. She has no wheezes. She has no rales.       Patient examined after she had received a  2.5 mg albuterol treatment in the emergency department  Abdominal: Soft.  Musculoskeletal: Normal range of motion.  Neurological: She is alert.  Skin: Skin is warm.    ED Course  Procedures (including critical care time)  Labs Reviewed - No data to display No results found.   1. URI (upper respiratory infection)   2. Asthma       MDM   URI        Arman Filter, NP 07/29/12 0865  Arman Filter, NP 07/29/12 7846  Arman Filter, NP 07/29/12 [phone]

## 2012-07-29 NOTE — ED Notes (Signed)
PT. REPORTS SOB , PRODUCTIVE COUGH WITH NASAL CONGESTION AND RUNNY NOSE FOR 2 DAYS .

## 2012-07-29 NOTE — ED Provider Notes (Signed)
History     CSN: [id number]  Arrival date & time 07/29/12  1123   None     Chief Complaint  Patient presents with  . Shortness of Breath    (Consider location/radiation/quality/duration/timing/severity/associated sxs/prior treatment) Patient is a 21 y.o. female presenting with shortness of breath. The history is provided by the patient.  Shortness of Breath  The current episode started 3 to 5 days ago. The onset was gradual. The problem occurs frequently. The problem has been gradually worsening. The problem is mild. The symptoms are relieved by beta-agonist inhalers. Nothing aggravates the symptoms. Associated symptoms include chest pain, orthopnea, rhinorrhea, sore throat, cough, shortness of breath and wheezing. Pertinent negatives include no chest pressure, no fever and no stridor. The cough is productive (yellow). The cough is relieved by beta-agonist inhalers. There was no intake of a foreign body. She was not exposed to toxic fumes. She has not inhaled smoke recently. She is currently using steroids (inhaled-Advair). She has had prior hospitalizations (6th grade). She has had no prior ICU admissions. She has had no prior intubations. Her past medical history is significant for asthma, bronchiolitis, past wheezing and asthma in the family. Her past medical history does not include eczema. She has been behaving normally. Urine output has been normal. The last void occurred less than 6 hours ago. There were sick contacts at home. Recently, medical care has been given at another facility (ED visit last night-pt received breathing tx and sent home to continue regular medications). Services received include medications given.    Past Medical History  Diagnosis Date  . Asthma   . Chlamydia   . Urinary tract infection, recurrent     Past Surgical History  Procedure Date  . Breast lumpectomy     Family History  Problem Relation Age of Onset  . Cancer Mother   . Heart disease Mother    . Hypertension Mother   . Mental illness Mother   . Hypertension Father   . Hypertension Maternal Aunt   . Hypertension Maternal Uncle   . Hypertension Maternal Grandmother   . Kidney disease Maternal Grandmother   . COPD Maternal Grandmother   . Cancer Maternal Grandmother   . Hypertension Maternal Grandfather     History  Substance Use Topics  . Smoking status: Never Smoker   . Smokeless tobacco: Never Used  . Alcohol Use: No    OB History    Grav Para Term Preterm Abortions TAB SAB Ect Mult Living   1 1 1       1       Review of Systems  Constitutional: Negative.  Negative for fever.  HENT: Positive for sore throat and rhinorrhea.   Respiratory: Positive for cough, shortness of breath and wheezing. Negative for stridor.   Cardiovascular: Positive for chest pain and orthopnea.  Neurological: Negative.     Allergies  Review of patient's allergies indicates no known allergies.  Home Medications   No current outpatient prescriptions on file.  BP 112/78  Pulse 112  Temp 98.6 F (37 C) (Oral)  Resp 28  SpO2 94%  Breastfeeding? No  Physical Exam  Nursing note and vitals reviewed. Constitutional: She is oriented to person, place, and time. Vital signs are normal. She appears well-developed and well-nourished. She is active and cooperative.  HENT:  Head: Normocephalic.  Right Ear: External ear normal.  Left Ear: External ear normal.  Mouth/Throat: Oropharynx is clear and moist. No oropharyngeal exudate.  Partial cerumen occlucion, TM's normal, +nasal discharge  Eyes: Conjunctivae normal are normal. Pupils are equal, round, and reactive to light. No scleral icterus.  Neck: Trachea normal and normal range of motion. Neck supple.  Cardiovascular: Normal rate, regular rhythm and normal heart sounds.   Pulmonary/Chest: No accessory muscle usage. No apnea and not bradypneic. No respiratory distress. She has wheezes. She has rhonchi.  Lymphadenopathy:       Head  (right side): No submental, no submandibular, no tonsillar, no preauricular, no posterior auricular and no occipital adenopathy present.       Head (left side): No submental, no submandibular, no tonsillar, no preauricular, no posterior auricular and no occipital adenopathy present.    She has no cervical adenopathy.  Neurological: She is alert and oriented to person, place, and time. No cranial nerve deficit or sensory deficit.  Skin: Skin is warm and dry.  Psychiatric: She has a normal mood and affect. Her speech is normal and behavior is normal. Judgment and thought content normal. Cognition and memory are normal.    ED Course  Procedures (including critical care time)  Labs Reviewed - No data to display Dg Chest Portable 1 View  07/30/2012  *RADIOLOGY REPORT*  Clinical Data: Shortness of breath, asthma  PORTABLE CHEST - 1 VIEW  Comparison: 05/29/2010  Findings: Mild bronchitic changes.  No focal consolidation.  No pleural effusion or pneumothorax.  Cardiomediastinal silhouette is within normal limits.  IMPRESSION: Mild bronchitic changes.   Original Report Authenticated By: Charline Bills, M.D.      1. Asthma exacerbation   2. Cough   3. Wheeze       MDM  Albuterol/atrovent neb administered in office.  Solumedrol 125mg  IM Post neb tx, wheezing continues, prolonged expiration noted, sats 88%-second albuterol 5mg  neb administered.   1512 Post 2nd neb, pt with rhonchi, no further wheezing.  sats improved to 94%ra, consult with Dr. Amada Jupiter for further management. 1545 Wheezing noted, 3rd breathing tx administered.  Discussed concerns and findings with patient.  Given options for further evaluation and treatment.  Reports she feels much better than initial presentation and would like to go home.  Instructed patient to report to ER if symptoms returned as further treatment would be warranted that would not be available in this facility.  Pt agreed.       Johnsie Kindred,  NP 07/31/12 1908

## 2012-07-29 NOTE — ED Notes (Signed)
Pt reports    Still  Has  Some  Wheezing  But is  Breathing  Better           After  Repeat  Treatments

## 2012-07-30 ENCOUNTER — Inpatient Hospital Stay (HOSPITAL_COMMUNITY)
Admission: EM | Admit: 2012-07-30 | Discharge: 2012-08-02 | DRG: 096 | Disposition: A | Discharge status: Home / Self Care | Payer: BC Managed Care – PPO | Attending: Internal Medicine | Admitting: Internal Medicine

## 2012-07-30 ENCOUNTER — Encounter (HOSPITAL_COMMUNITY): Payer: Self-pay | Admitting: Emergency Medicine

## 2012-07-30 ENCOUNTER — Emergency Department (HOSPITAL_COMMUNITY): Payer: BC Managed Care – PPO

## 2012-07-30 DIAGNOSIS — R739 Hyperglycemia, unspecified: Secondary | ICD-10-CM

## 2012-07-30 DIAGNOSIS — R0603 Acute respiratory distress: Secondary | ICD-10-CM

## 2012-07-30 DIAGNOSIS — IMO0002 Reserved for concepts with insufficient information to code with codable children: Secondary | ICD-10-CM

## 2012-07-30 DIAGNOSIS — R0602 Shortness of breath: Secondary | ICD-10-CM

## 2012-07-30 DIAGNOSIS — J45909 Unspecified asthma, uncomplicated: Secondary | ICD-10-CM

## 2012-07-30 DIAGNOSIS — J209 Acute bronchitis, unspecified: Secondary | ICD-10-CM | POA: Diagnosis present

## 2012-07-30 DIAGNOSIS — T380X5A Adverse effect of glucocorticoids and synthetic analogues, initial encounter: Secondary | ICD-10-CM | POA: Diagnosis present

## 2012-07-30 DIAGNOSIS — Z22330 Carrier of Group B streptococcus: Secondary | ICD-10-CM

## 2012-07-30 DIAGNOSIS — J45902 Unspecified asthma with status asthmaticus: Principal | ICD-10-CM

## 2012-07-30 DIAGNOSIS — R7309 Other abnormal glucose: Secondary | ICD-10-CM | POA: Diagnosis present

## 2012-07-30 DIAGNOSIS — J984 Other disorders of lung: Secondary | ICD-10-CM

## 2012-07-30 DIAGNOSIS — R6510 Systemic inflammatory response syndrome (SIRS) of non-infectious origin without acute organ dysfunction: Secondary | ICD-10-CM

## 2012-07-30 DIAGNOSIS — Z23 Encounter for immunization: Secondary | ICD-10-CM

## 2012-07-30 DIAGNOSIS — Y92009 Unspecified place in unspecified non-institutional (private) residence as the place of occurrence of the external cause: Secondary | ICD-10-CM

## 2012-07-30 DIAGNOSIS — A749 Chlamydial infection, unspecified: Secondary | ICD-10-CM

## 2012-07-30 DIAGNOSIS — Z79899 Other long term (current) drug therapy: Secondary | ICD-10-CM

## 2012-07-30 DIAGNOSIS — R0902 Hypoxemia: Secondary | ICD-10-CM | POA: Diagnosis present

## 2012-07-30 LAB — BASIC METABOLIC PANEL
BUN: 11 mg/dL (ref 6–23)
BUN: 12 mg/dL (ref 6–23)
CO2: 21 mEq/L (ref 19–32)
CO2: 19 mEq/L (ref 19–32)
Calcium: 9.2 mg/dL (ref 8.4–10.5)
Creatinine, Ser: 0.71 mg/dL (ref 0.50–1.10)
GFR calc non Af Amer: >90 mL/min (ref >90)
GFR calc non Af Amer: >90 mL/min (ref >90)
Glucose, Bld: 209 mg/dL — ABNORMAL HIGH (ref 70–99)
Glucose, Bld: 260 mg/dL — ABNORMAL HIGH (ref 70–99)
Potassium: 3.9 mEq/L (ref 3.5–5.1)
Sodium: 136 mEq/L (ref 135–145)
Sodium: 137 mEq/L (ref 135–145)

## 2012-07-30 LAB — CBC
MCH: 28.8 pg (ref 26.0–34.0)
MCHC: 33.3 g/dL (ref 30.0–36.0)
MCV: 86.4 fL (ref 78.0–100.0)
Platelets: 191 10*3/uL (ref 150–400)
RBC: 4.62 MIL/uL (ref 3.87–5.11)
RDW: 13.2 % (ref 11.5–15.5)

## 2012-07-30 LAB — POCT I-STAT 3, VENOUS BLOOD GAS (G3P V)
Acid-base deficit: 2 mmol/L (ref 0.0–2.0)
Bicarbonate: 22.5 mEq/L (ref 20.0–24.0)
O2 Saturation: 70 %
TCO2: 24 mmol/L (ref 0–100)
pCO2, Ven: 38 mmHg — ABNORMAL LOW (ref 45.0–50.0)
pO2, Ven: 37 mmHg (ref 30.0–45.0)

## 2012-07-30 LAB — GLUCOSE, CAPILLARY
Glucose-Capillary: 118 mg/dL — ABNORMAL HIGH (ref 70–99)
Glucose-Capillary: 139 mg/dL — ABNORMAL HIGH (ref 70–99)
Glucose-Capillary: 324 mg/dL — ABNORMAL HIGH (ref 70–99)

## 2012-07-30 LAB — CBC WITH DIFFERENTIAL/PLATELET
Eosinophils Absolute: 0 10*3/uL (ref 0.0–0.7)
Eosinophils Relative: 0 % (ref 0–5)
Hemoglobin: 14.2 g/dL (ref 12.0–15.0)
Lymphocytes Relative: 9 % — ABNORMAL LOW (ref 12–46)
Lymphs Abs: 0.7 10*3/uL (ref 0.7–4.0)
MCH: 29 pg (ref 26.0–34.0)
MCV: 85.9 fL (ref 78.0–100.0)
Monocytes Relative: 2 % — ABNORMAL LOW (ref 3–12)
Neutrophils Relative %: 89 % — ABNORMAL HIGH (ref 43–77)
RBC: 4.89 MIL/uL (ref 3.87–5.11)
WBC: 8.7 10*3/uL (ref 4.0–10.5)

## 2012-07-30 LAB — HEMOGLOBIN A1C
Hgb A1c MFr Bld: 5.3 % (ref <5.7)
Mean Plasma Glucose: 105 mg/dL (ref <117)

## 2012-07-30 MED ORDER — METHYLPREDNISOLONE SODIUM SUCC 125 MG IJ SOLR
80.0000 mg | Freq: Three times a day (TID) | INTRAMUSCULAR | Status: DC
Start: 2012-07-31 — End: 2012-07-30

## 2012-07-30 MED ORDER — METHYLPREDNISOLONE SODIUM SUCC 125 MG IJ SOLR
125.0000 mg | Freq: Once | INTRAMUSCULAR | Status: AC
  Administered 2012-07-30: 125 mg via INTRAVENOUS
  Filled 2012-07-30: qty 2

## 2012-07-30 MED ORDER — ONDANSETRON HCL 4 MG PO TABS: 4.0000 mg | ORAL_TABLET | Freq: Four times a day (QID) | ORAL | Status: DC | PRN

## 2012-07-30 MED ORDER — IPRATROPIUM BROMIDE 0.02 % IN SOLN
1.0000 mg | Freq: Once | RESPIRATORY_TRACT | Status: AC
  Administered 2012-07-30: 1 mg via RESPIRATORY_TRACT
  Filled 2012-07-30: qty 5

## 2012-07-30 MED ORDER — MAGNESIUM SULFATE 40 MG/ML IJ SOLN
2.0000 g | Freq: Once | INTRAMUSCULAR | Status: AC
  Administered 2012-07-30: 2 g via INTRAVENOUS
  Filled 2012-07-30: qty 50

## 2012-07-30 MED ORDER — IPRATROPIUM BROMIDE 0.02 % IN SOLN
0.5000 mg | RESPIRATORY_TRACT | Status: DC
  Administered 2012-07-30 – 2012-07-31 (×6): 0.5 mg via RESPIRATORY_TRACT
  Filled 2012-07-30 (×5): qty 2.5

## 2012-07-30 MED ORDER — ALUM & MAG HYDROXIDE-SIMETH 200-200-20 MG/5ML PO SUSP: 30.0000 mL | Freq: Four times a day (QID) | ORAL | Status: DC | PRN

## 2012-07-30 MED ORDER — INSULIN ASPART 100 UNIT/ML ~~LOC~~ SOLN
8.0000 [IU] | Freq: Once | SUBCUTANEOUS | Status: AC
  Administered 2012-07-30: 8 [IU] via SUBCUTANEOUS

## 2012-07-30 MED ORDER — ALBUTEROL SULFATE (5 MG/ML) 0.5% IN NEBU
2.5000 mg | INHALATION_SOLUTION | RESPIRATORY_TRACT | Status: DC
  Administered 2012-07-30 – 2012-07-31 (×6): 2.5 mg via RESPIRATORY_TRACT
  Filled 2012-07-30 (×5): qty 0.5

## 2012-07-30 MED ORDER — ACETAMINOPHEN 650 MG RE SUPP: 650.0000 mg | Freq: Four times a day (QID) | RECTAL | Status: DC | PRN

## 2012-07-30 MED ORDER — ALBUTEROL SULFATE (5 MG/ML) 0.5% IN NEBU
2.5000 mg | INHALATION_SOLUTION | Freq: Four times a day (QID) | RESPIRATORY_TRACT | Status: DC
  Administered 2012-07-30: 2.5 mg via RESPIRATORY_TRACT
  Filled 2012-07-30: qty 0.5

## 2012-07-30 MED ORDER — ACETAMINOPHEN 325 MG PO TABS: 650.0000 mg | ORAL_TABLET | Freq: Four times a day (QID) | ORAL | Status: DC | PRN

## 2012-07-30 MED ORDER — ALBUTEROL SULFATE (5 MG/ML) 0.5% IN NEBU
2.5000 mg | INHALATION_SOLUTION | RESPIRATORY_TRACT | Status: DC | PRN
  Administered 2012-07-30 (×2): 2.5 mg via RESPIRATORY_TRACT
  Filled 2012-07-30: qty 0.5

## 2012-07-30 MED ORDER — DEXTROSE 5 % IV SOLN
500.0000 mg | Freq: Once | INTRAVENOUS | Status: AC
  Administered 2012-07-30: 500 mg via INTRAVENOUS
  Filled 2012-07-30: qty 500

## 2012-07-30 MED ORDER — ALBUTEROL SULFATE (5 MG/ML) 0.5% IN NEBU: 5.0000 mg | INHALATION_SOLUTION | Freq: Once | RESPIRATORY_TRACT | Status: DC

## 2012-07-30 MED ORDER — ALBUTEROL SULFATE (5 MG/ML) 0.5% IN NEBU
5.0000 mg | INHALATION_SOLUTION | RESPIRATORY_TRACT | Status: DC | PRN
  Administered 2012-07-30 – 2012-08-01 (×4): 5 mg via RESPIRATORY_TRACT
  Filled 2012-07-30 (×4): qty 0.5
  Filled 2012-07-30: qty 1

## 2012-07-30 MED ORDER — ENOXAPARIN SODIUM 40 MG/0.4ML ~~LOC~~ SOLN
40.0000 mg | SUBCUTANEOUS | Status: DC
  Administered 2012-07-30 – 2012-08-01 (×3): 40 mg via SUBCUTANEOUS
  Filled 2012-07-30 (×4): qty 0.4

## 2012-07-30 MED ORDER — ONDANSETRON HCL 4 MG/2ML IJ SOLN: 4.0000 mg | Freq: Four times a day (QID) | INTRAMUSCULAR | Status: DC | PRN

## 2012-07-30 MED ORDER — INFLUENZA VIRUS VACC SPLIT PF IM SUSP
0.5000 mL | INTRAMUSCULAR | Status: AC
  Administered 2012-07-31: 0.5 mL via INTRAMUSCULAR
  Filled 2012-07-30: qty 0.5

## 2012-07-30 MED ORDER — OXYCODONE HCL 5 MG PO TABS: 5.0000 mg | ORAL_TABLET | ORAL | Status: DC | PRN

## 2012-07-30 MED ORDER — GUAIFENESIN ER 600 MG PO TB12
600.0000 mg | ORAL_TABLET | Freq: Two times a day (BID) | ORAL | Status: DC
  Administered 2012-07-30 – 2012-08-02 (×7): 600 mg via ORAL
  Filled 2012-07-30 (×8): qty 1

## 2012-07-30 MED ORDER — METHYLPREDNISOLONE SODIUM SUCC 125 MG IJ SOLR
125.0000 mg | Freq: Four times a day (QID) | INTRAMUSCULAR | Status: DC
  Filled 2012-07-30 (×4): qty 2

## 2012-07-30 MED ORDER — INSULIN ASPART 100 UNIT/ML ~~LOC~~ SOLN
0.0000 [IU] | Freq: Three times a day (TID) | SUBCUTANEOUS | Status: DC
  Administered 2012-07-30 – 2012-08-01 (×6): 1 [IU] via SUBCUTANEOUS
  Administered 2012-08-01: 2 [IU] via SUBCUTANEOUS

## 2012-07-30 MED ORDER — DEXTROSE 5 % IV SOLN
500.0000 mg | INTRAVENOUS | Status: DC
  Filled 2012-07-30: qty 500

## 2012-07-30 MED ORDER — ALBUTEROL SULFATE (5 MG/ML) 0.5% IN NEBU
10.0000 mg | INHALATION_SOLUTION | Freq: Once | RESPIRATORY_TRACT | Status: AC
  Administered 2012-07-30: 10 mg via RESPIRATORY_TRACT
  Filled 2012-07-30: qty 80

## 2012-07-30 MED ORDER — HYDROMORPHONE HCL PF 1 MG/ML IJ SOLN: 0.5000 mg | INTRAMUSCULAR | Status: DC | PRN

## 2012-07-30 MED ORDER — MOXIFLOXACIN HCL IN NACL 400 MG/250ML IV SOLN
400.0000 mg | INTRAVENOUS | Status: AC
  Administered 2012-07-30 – 2012-08-01 (×3): 400 mg via INTRAVENOUS
  Filled 2012-07-30 (×3): qty 250

## 2012-07-30 MED ORDER — SODIUM CHLORIDE 0.9 % IV SOLN
INTRAVENOUS | Status: DC
  Administered 2012-07-30 (×2): via INTRAVENOUS

## 2012-07-30 MED ORDER — ZOLPIDEM TARTRATE 5 MG PO TABS
5.0000 mg | ORAL_TABLET | Freq: Every evening | ORAL | Status: DC | PRN
  Administered 2012-07-31: 5 mg via ORAL
  Filled 2012-07-30 (×2): qty 1

## 2012-07-30 MED ORDER — METHYLPREDNISOLONE SODIUM SUCC 125 MG IJ SOLR
60.0000 mg | Freq: Four times a day (QID) | INTRAMUSCULAR | Status: DC
  Administered 2012-07-30 – 2012-08-01 (×8): 60 mg via INTRAVENOUS
  Filled 2012-07-30 (×12): qty 0.96

## 2012-07-30 NOTE — ED Notes (Signed)
Reports having asthma flare up; was here last night for same and tx, went to [facility] today as well, but keeps flaring up after leaving; pt has audible wheezing

## 2012-07-30 NOTE — Progress Notes (Signed)
[facility] PROGRESS NOTE  Terri Howard ZOX:[id number] DOB: 14-Mar-1991 DOA: 07/30/2012 PCP: Pcp Not In System  Assessment/Plan: Principal Problem:  *Status asthmaticus Active Problems:  SOB (shortness of breath)  Hypoxemia  Acute respiratory distress   1. Acute severe asthma: This patient with known history of bronchial asthma, presented with 48 hours of worsening SOB, wheeze and productive cough, poorly responsive to multiple bronchodilator treatments. Commenced on iv Azithromycin, parenteral steroids, mucolytics, bronchodilators and oxygen supplementation. According to patient, her asthma can prove somewhat difficult to control. Will need Pulmonology follow up on discharge.  2. Acute bronchitis: Patient has had a cough, productive of yellow phlegm for about 4 days, and her sick contact is her son, who is just recovering from a cold. CXR revealed bronchitic changes, but no pneumonic consolidation. These are features of acute bronchitis, which is the likely precipitant for her acute asthma attack. Managing as described above, but will change antibiotic to Avelox.  3. Respiratory failure. In the ED, patient was found to have an O2 saturation of 88%, consistent with acute hypoxic respiratory failure, secondary to #s 1 & 2 above. Improvement is anticipated with specific management of underlying conditions. Patient is currently saturating at 93% on 2L.  4. Hyperglycemia: Random Glucose is significantly elevated at 209-260. Patient has no known previous history of DM. This is probably steroid induced, as patient was on oral steroids, pre-admission. Will manage with SSI, and check HBA1C.   Code Status: Full Code.  Family Communication:  Disposition Plan: To be determined.   Brief narrative: 21 y.o. female with a history of Asthma, presenting with worsening SOB, chest tightness, and wheezing over the past 48 hours. She presented to the ED, and then to the [facility]. She would have temporary  relief , following nebulizer treatment, and then relapse. When she returned to the ED later, she was found to have an O2 saturation of 88%. She denied having fevers or chills but does report coughing up yellow sputum. She was admitted for further management.     Consultants:  N/A.  Procedures:  CXR.   Antibiotics:  Azithromycin 07/30/12 only.  Avelox 07/30/12>>>  HPI/Subjective: Feels marginally better. Chest is still tight.   Objective: Vital signs in last 24 hours: Temp:  [98.2 F (36.8 C)-98.8 F (37.1 C)] 98.2 F (36.8 C) (10/26 0710) Pulse Rate:  [64-128] 119  (10/26 0710) Resp:  [16-29] 25  (10/26 0445) BP: (90-118)/(42-78) 106/57 mmHg (10/26 0712) SpO2:  [89 %-100 %] 93 % (10/26 0754) Weight:  [53.6 kg (118 lb 2.7 oz)-54.3 kg (119 lb 11.4 oz)] 54.3 kg (119 lb 11.4 oz) (10/26 0734) Weight change:  Last BM Date: 07/29/12  Intake/Output from previous day:   Total I/O In: 300 [I.V.:300] Out: -    Physical Exam: General: Mildly SOB at rest, however, able to talk in complete sentences, fully oriented.  HEENT:  No clinical pallor, no jaundice, no conjunctival injection or discharge. NECK:  Supple, JVP not seen, no carotid bruits, no palpable lymphadenopathy, no palpable goiter. CHEST:  Bilateral polyphonic expiratory wheeze, no crackles. Marland Kitchen HEART:  Sounds 1 and 2 heard, normal, regular, no murmurs, mildly tachycardic. ABDOMEN:  Flat, soft, non-tender, no palpable organomegaly, no palpable masses, normal bowel sounds. GENITALIA:  Not examined. LOWER EXTREMITIES:  No pitting edema, palpable peripheral pulses. MUSCULOSKELETAL SYSTEM:  Unremarkable. CENTRAL NERVOUS SYSTEM:  No focal neurologic deficit on gross examination.  Lab Results:  Basename 07/30/12 0451 07/30/12 0121  WBC 11.9* 8.7  HGB 13.3 14.2  HCT 39.9 42.0  PLT 191 194    Basename 07/30/12 0451 07/30/12 0121  NA 137 136  K 4.0 3.9  CL 104 101  CO2 19 21  GLUCOSE 260* 209*  BUN 12 11    CREATININE 0.71 0.68  CALCIUM 9.2 9.5   No results found for this or any previous visit (from the past 240 hour(s)).   Studies/Results: Dg Chest Portable 1 View  07/30/2012  *RADIOLOGY REPORT*  Clinical Data: Shortness of breath, asthma  PORTABLE CHEST - 1 VIEW  Comparison: 05/29/2010  Findings: Mild bronchitic changes.  No focal consolidation.  No pleural effusion or pneumothorax.  Cardiomediastinal silhouette is within normal limits.  IMPRESSION: Mild bronchitic changes.   Original Report Authenticated By: Charline Bills, M.D.     Medications: Scheduled Meds:   . albuterol  10 mg Nebulization Once  . albuterol  2.5 mg Nebulization Q6H  . azithromycin (ZITHROMAX) 500 MG IVPB  500 mg Intravenous Once  . azithromycin  500 mg Intravenous Q24H  . enoxaparin (LOVENOX) injection  40 mg Subcutaneous Q24H  . influenza  inactive virus vaccine  0.5 mL Intramuscular Tomorrow-1000  . ipratropium  1 mg Nebulization Once  . magnesium sulfate  2 g Intravenous Once  . methylPREDNISolone (SOLU-MEDROL) injection  125 mg Intravenous Once  . methylPREDNISolone (SOLU-MEDROL) injection  125 mg Intravenous Q6H  . methylPREDNISolone (SOLU-MEDROL) injection  80 mg Intravenous Q8H  . DISCONTD: albuterol  5 mg Nebulization Once   Continuous Infusions:   . sodium chloride 100 mL/hr at 07/30/12 0455   PRN Meds:.acetaminophen, acetaminophen, albuterol, alum & mag hydroxide-simeth, HYDROmorphone (DILAUDID) injection, ondansetron (ZOFRAN) IV, ondansetron, oxyCODONE, zolpidem    LOS: 0 days   Jeni Duling,CHRISTOPHER  [facility] Pager [phone]. If 8PM-8AM, please contact night-coverage at [email or web address], password [facility] 07/30/2012, 8:30 AM  LOS: 0 days

## 2012-07-30 NOTE — ED Notes (Signed)
Pt reports SOB upon walking. Pt appears SOB and has audible wheezing. Placed pt on 2L per nasal cannula.

## 2012-07-30 NOTE — H&P (Addendum)
[facility] History and Physical  Ruqayya Laird UJW:[id number] DOB: 1991-04-17 DOA: 07/30/2012  Referring physician:  PCP: Pcp Not In System  Specialists:   Chief Complaint: SOB, Wheezing  HPI: Terri Howard is a 21 y.o. female with a history of Asthma who presents with complaints of worsening SOB, chest tightness, and wheezing over the past 48 hours.  She presented to the ED, and then to the [facility]. She would have temporary relief but her symptoms would return.   When she returned to the ED in the evening she was found to have an o2 saturation of 88%.  She denied having fevers or chills but does report coughing up yellow sputum.     Review of Systems: The patient denies anorexia, fever, weight loss, vision loss, decreased hearing, hoarseness, chest pain, syncope, peripheral edema, balance deficits, hemoptysis, abdominal pain, melena, hematochezia, severe indigestion/heartburn, hematuria, incontinence, genital sores, muscle weakness, suspicious skin lesions, transient blindness, difficulty walking, depression, unusual weight change, abnormal bleeding, enlarged lymph nodes, angioedema, and breast masses.    Past Medical History  Diagnosis Date  . Asthma   . Chlamydia   . Urinary tract infection, recurrent    Past Surgical History  Procedure Date  . Breast lumpectomy                     Benign Cyst excision Right Breast   Medications:  HOME MEDS: Prior to Admission medications   Medication Sig Start Date End Date Taking? Authorizing Provider  albuterol (PROVENTIL HFA;VENTOLIN HFA) 108 (90 BASE) MCG/ACT inhaler Inhale 2 puffs into the lungs daily as needed. For asthma 07/29/12  Yes Johnsie Kindred, NP  cetirizine (ZYRTEC) 10 MG chewable tablet Chew 1 tablet (10 mg total) by mouth daily. 07/29/12  Yes Johnsie Kindred, NP  Fluticasone-Salmeterol (ADVAIR) 250-50 MCG/DOSE AEPB Inhale 1 puff into the lungs every 12 (twelve) hours. 07/29/12  Yes Johnsie Kindred, NP  predniSONE  (DELTASONE) 20 MG tablet Take 2 tablets (40 mg total) by mouth daily. 07/29/12  Yes Johnsie Kindred, NP   No Known Allergies    Social History:  reports that she has never smoked. She does not have any smokeless tobacco history on file. She reports that she does not drink alcohol or use illicit drugs.   Family History  Problem Relation Age of Onset  . Cancer Mother   . Heart disease Mother   . Hypertension Mother   . Mental illness Mother   . Hypertension Father   . Hypertension Maternal Aunt   . Hypertension Maternal Uncle   . Hypertension Maternal Grandmother   . Kidney disease Maternal Grandmother   . COPD Maternal Grandmother   . Cancer Maternal Grandmother   . Hypertension Maternal Grandfather       Physical Exam:  GEN:  Pleasant Well nourished and well developed 21 year old African American Female examined  and in no acute distress; cooperative with exam Filed Vitals:   07/30/12 0245 07/30/12 0300 07/30/12 0315 07/30/12 0330  BP: 103/47 97/49 99/44  91/45  Pulse: 103 115 110 116  Temp:      TempSrc:      Resp: 16 21 21 24   SpO2: 100% 100% 100% 100%   Blood pressure 91/45, pulse 116, temperature 98.8 F (37.1 C), temperature source Oral, resp. rate 24, SpO2 100.00%. PSYCH: She is alert and oriented x4; does not appear anxious does not appear depressed; affect is normal HEENT: Normocephalic and Atraumatic, Mucous membranes pink; PERRLA; EOM  intact; Fundi:  Benign;  No scleral icterus, Nares: Patent, Oropharynx: Clear, Fair Dentition, Neck:  FROM, no cervical lymphadenopathy nor thyromegaly or carotid bruit; no JVD; Breasts:: Not examined CHEST WALL: No tenderness CHEST: Decreased Breath sounds Bilaterally, Diffuse Expiratory wheezes,  HEART: Regular rate and rhythm; no murmurs rubs or gallops BACK: No kyphosis or scoliosis; no CVA tenderness ABDOMEN: Positive Bowel Sounds, soft non-tender; no masses, no organomegaly.   Rectal Exam: Not done EXTREMITIES: No bone or  joint deformity; age-appropriate arthropathy of the hands and knees; no cyanosis, clubbing or edema; no ulcerations. Genitalia: not examined PULSES: 2+ and symmetric SKIN: Normal hydration no rash or ulceration CNS: Cranial nerves 2-12 grossly intact no focal neurologic deficit    Labs on Admission:  Basic Metabolic Panel:  Lab 07/30/12 1610  NA 136  K 3.9  CL 101  CO2 21  GLUCOSE 209*  BUN 11  CREATININE 0.68  CALCIUM 9.5  MG --  PHOS --   Liver Function Tests: No results found for this basename: AST:5,ALT:5,ALKPHOS:5,BILITOT:5,PROT:5,ALBUMIN:5 in the last 168 hours No results found for this basename: LIPASE:5,AMYLASE:5 in the last 168 hours No results found for this basename: AMMONIA:5 in the last 168 hours CBC:  Lab 07/30/12 0121  WBC 8.7  NEUTROABS 7.8*  HGB 14.2  HCT 42.0  MCV 85.9  PLT 194   Cardiac Enzymes: No results found for this basename: CKTOTAL:5,CKMB:5,CKMBINDEX:5,TROPONINI:5 in the last 168 hours  BNP (last 3 results) No results found for this basename: PROBNP:3 in the last 8760 hours CBG: No results found for this basename: GLUCAP:5 in the last 168 hours  Radiological Exams on Admission: Dg Chest Portable 1 View  07/30/2012  *RADIOLOGY REPORT*  Clinical Data: Shortness of breath, asthma  PORTABLE CHEST - 1 VIEW  Comparison: 05/29/2010  Findings: Mild bronchitic changes.  No focal consolidation.  No pleural effusion or pneumothorax.  Cardiomediastinal silhouette is within normal limits.  IMPRESSION: Mild bronchitic changes.   Original Report Authenticated By: Charline Bills, M.D.      Assessment:   Principal Problem:  *Status asthmaticus Active Problems:  Acute respiratory distress  SOB (shortness of breath)  Hypoxemia    Plan:    Admit Start High dose IV Steroid taper IV Azithromycin Albuterol Nebs Monitor O2 Sats Supplemental O2 PRN DVT prophylaxis   Code Status:  FULL CODE Family Communication:  N/A Disposition Plan:   Return PRN  Time spent: 8 Minutes  Ron Parker [facility] Pager [phone]   If 7PM-7AM, please contact night-coverage [email or web address] Password [email or web address] 07/30/2012, 4:55 AM

## 2012-07-30 NOTE — ED Provider Notes (Signed)
History     CSN: [id number]  Arrival date & time 07/30/12  0027   First MD Initiated Contact with Patient 07/30/12 [phone]      Chief Complaint  Patient presents with  . Shortness of Breath    (Consider location/radiation/quality/duration/timing/severity/associated sxs/prior treatment) HPI  This patient is a 21 yo F with a history of asthma requiring hospitalization x 2 as a child - never intubation.   She presents for the 2nd time in the past 24 hrs to this ED with complaints of SOB and wheezing. She was treated yesterday morning with several nebs, had improvement in symptoms and was discharged. The patient says she felt better at discharge. But, by the time she got back to her home - which is less than 49m away - she had developed recurrent wheezing. She had climbing the 3 flights of stairs up to her apartment.   Within a couple of hours of returning home, her symptoms worsened to the point that she went to a local urgent care for treatment. She had used her nebulized Albuterol at home with inadequate relief prior to this. The patient says she was at the urgent care center from 11a to 5p yesterday and received 3-4 nebulized treatments and "a steroid shot in my butt".  She felt better when she left but, her sx returned again shortly thereafter.   The patient denies chest pain. She has a slight, nonproductive cough. No fever. Has been using Albuterol nebs at home PTA without adequate relief.  The patient is a non-smoker and does not have significant exposure to second hand smoke. She does not have occupational exposure to any particulate matter or aerosolized chemicals which might induce exacerbation of her asthma.   Past Medical History  Diagnosis Date  . Asthma   . Chlamydia   . Urinary tract infection, recurrent     Past Surgical History  Procedure Date  . Breast lumpectomy     Family History  Problem Relation Age of Onset  . Cancer Mother   . Heart disease Mother   .  Hypertension Mother   . Mental illness Mother   . Hypertension Father   . Hypertension Maternal Aunt   . Hypertension Maternal Uncle   . Hypertension Maternal Grandmother   . Kidney disease Maternal Grandmother   . COPD Maternal Grandmother   . Cancer Maternal Grandmother   . Hypertension Maternal Grandfather     History  Substance Use Topics  . Smoking status: Never Smoker   . Smokeless tobacco: Not on file  . Alcohol Use: No    OB History    Grav Para Term Preterm Abortions TAB SAB Ect Mult Living   1 1 1       1       Review of Systems Gen: no weight loss, fevers, chills, night sweats Eyes: no discharge or drainage, no occular pain or visual changes Nose: no epistaxis, + runny nose with clear nasal drainage and sensation of nasal congestion Mouth: no dental pain, no sore throat Neck: no neck pain Lungs: As per history of present illness, otherwise negative CV: no chest pain, palpitations, dependent edema or orthopnea Abd: no abdominal pain, nausea, vomiting GU: no dysuria or gross hematuria MSK: no myalgias or arthralgias Neuro: no headache, no focal neurologic deficits Skin: no rash Psyche: negative.     Allergies  Review of patient's allergies indicates no known allergies.  Home Medications   Current Outpatient Rx  Name Route Sig Dispense Refill  .  ALBUTEROL SULFATE HFA 108 (90 BASE) MCG/ACT IN AERS Inhalation Inhale 2 puffs into the lungs daily as needed. For asthma 1 Inhaler 2  . CETIRIZINE HCL 10 MG PO CHEW Oral Chew 1 tablet (10 mg total) by mouth daily. 30 tablet 1  . FLUTICASONE-SALMETEROL 250-50 MCG/DOSE IN AEPB Inhalation Inhale 1 puff into the lungs every 12 (twelve) hours. 60 each 1  . PREDNISONE 20 MG PO TABS Oral Take 2 tablets (40 mg total) by mouth daily. 12 tablet 0    BP 115/55  Pulse 114  Temp 98.8 F (37.1 C) (Oral)  Resp 18  SpO2 92%  Physical Exam Gen: well developed and well nourished appearing Head: NCAT Eyes: PERL,  EOMI Nose: no epistaixis or rhinorrhea Mouth/throat: mucosa is moist and pink Neck: supple, no stridor Lungs: Pulse ox 88% on room air, respiratory rate 32-36 times per minute, breath sounds diminished throughout with scattered high pitched wheezing. Patient is using accessory muscles of respiration, she is able to speak in full sentences without any apparent difficulty. CV: rapid and regular, no murmur appreciated, extremities well perfused.  Abd: soft, notender, nondistended Back: no ttp, no cva ttp Skin: no rashes, wnl Neuro: CN ii-xii grossly intact, no focal deficits Psyche; mildly anxious affect,  calm and cooperative.   ED Course  Procedures (including critical care time)  DDX: status asthmaticus, pneumonia, ptx, plerual effusion, bronchitis.  Chest x-ray:  Consistent with bronchitis, no focal infiltrate, no PTX  Patient identified to be experiencing status asthmaticus with acute respiratory distress.  Placed on supplemental 02. Patient received an hour long tx of nebulized Albuterol 10mg  and Atrovent 1mg .  Her sats improved to the 98% range on RA and tachypnea improved. Air movement improved but, patient still has diffuse wheezing in all lung fields.  Tx empirically with Zithromax for bronchitis.  Treated with mag sulfate 2mg  IV for status asthmaticus with respiratory distress and inadequate response to Albuterol and Atrovent.  Case discussed with Dr. Lovell Sheehan who will see and admit the patient.   CRITICAL CARE Performed by: Brandt Loosen   Total critical care time: 40  Critical care time was exclusive of separately billable procedures and treating other patients.  Critical care was necessary to treat or prevent imminent or life-threatening deterioration.  Critical care was time spent personally by me on the following activities: development of treatment plan with patient and/or surrogate as well as nursing, discussions with consultants, evaluation of patient's response to  treatment, examination of patient, obtaining history from patient or surrogate, ordering and performing treatments and interventions, ordering and review of laboratory studies, ordering and review of radiographic studies, pulse oximetry and re-evaluation of patient's condition.             MDM  See above please.         Brandt Loosen, MD 07/30/12 [phone]

## 2012-07-31 LAB — CBC
HCT: 38.6 % (ref 36.0–46.0)
MCH: 28.1 pg (ref 26.0–34.0)
MCHC: 32.1 g/dL (ref 30.0–36.0)
MCV: 87.5 fL (ref 78.0–100.0)
Platelets: 204 10*3/uL (ref 150–400)
RDW: 13.7 % (ref 11.5–15.5)

## 2012-07-31 LAB — COMPREHENSIVE METABOLIC PANEL
AST: 21 U/L (ref 0–37)
Albumin: 3.2 g/dL — ABNORMAL LOW (ref 3.5–5.2)
BUN: 12 mg/dL (ref 6–23)
Calcium: 8.3 mg/dL — ABNORMAL LOW (ref 8.4–10.5)
Creatinine, Ser: 0.58 mg/dL (ref 0.50–1.10)
Total Bilirubin: 0.2 mg/dL — ABNORMAL LOW (ref 0.3–1.2)
Total Protein: 6.4 g/dL (ref 6.0–8.3)

## 2012-07-31 LAB — GLUCOSE, CAPILLARY
Glucose-Capillary: 126 mg/dL — ABNORMAL HIGH (ref 70–99)
Glucose-Capillary: 257 mg/dL — ABNORMAL HIGH (ref 70–99)

## 2012-07-31 MED ORDER — ALBUTEROL SULFATE (5 MG/ML) 0.5% IN NEBU
2.5000 mg | INHALATION_SOLUTION | Freq: Four times a day (QID) | RESPIRATORY_TRACT | Status: DC
  Administered 2012-07-31 – 2012-08-02 (×7): 2.5 mg via RESPIRATORY_TRACT
  Filled 2012-07-31 (×9): qty 0.5

## 2012-07-31 MED ORDER — IPRATROPIUM BROMIDE 0.02 % IN SOLN
0.5000 mg | Freq: Four times a day (QID) | RESPIRATORY_TRACT | Status: DC
  Administered 2012-07-31 – 2012-08-02 (×7): 0.5 mg via RESPIRATORY_TRACT
  Filled 2012-07-31 (×8): qty 2.5

## 2012-07-31 NOTE — Progress Notes (Signed)
[facility] PROGRESS NOTE  Terri Howard:[id number] DOB: [birth year] DOA: 07/30/2012 PCP: Pcp Not In System  Assessment/Plan: Principal Problem:  *Status asthmaticus Active Problems:  SOB (shortness of breath)  Hypoxemia  Acute respiratory distress  Acute bronchitis  Hyperglycemia   1. Acute severe asthma: This patient with known history of bronchial asthma, presented with 48 hours of worsening SOB, wheeze and productive cough, poorly responsive to multiple bronchodilator treatments. Commenced on iv antibiotics, parenteral steroids, mucolytics, bronchodilators and oxygen supplementation. According to patient, her asthma can prove somewhat difficult to control. Will need Pulmonology follow up on discharge. Overnight, she has experienced considerable improvement. Continue current management, but will reduce nebulizer frequency today, and start oral steroid taper from 08/01/12.  2. Acute bronchitis: Patient has had a cough, productive of yellow phlegm for about 4 days prior to presentation, and her sick contact is her son, who is just recovering from a cold. CXR revealed bronchitic changes, but no pneumonic consolidation. These are features of acute bronchitis, which is the likely precipitant for her acute asthma attack. Managing as described above. On Avelox, day# 2.  3. Respiratory failure. In the ED, patient was found to have an O2 saturation of 88%, consistent with acute hypoxic respiratory failure, secondary to #s 1 & 2 above. Improvement is anticipated with specific management of underlying conditions. Patient is currently saturating at 93%-95% on 1-2L.  4. Hyperglycemia: Random Glucose was significantly elevated at 209-260. Patient has no known previous history of DM. This is probably steroid induced, as patient was on oral steroids, pre-admission. Managing with SSI, and CBGs are reasonable, today. HBA1C is 5.3.   Code Status: Full Code.  Family Communication:  Disposition Plan:  To be determined.   Brief narrative: 21 y.o. female with a history of Asthma, presenting with worsening SOB, chest tightness, and wheezing over the past 48 hours. She presented to the ED, and then to the [facility]. She would have temporary relief , following nebulizer treatment, and then relapse. When she returned to the ED later, she was found to have an O2 saturation of 88%. She denied having fevers or chills but does report coughing up yellow sputum. She was admitted for further management.     Consultants:  N/A.  Procedures:  CXR.   Antibiotics:  Azithromycin 07/30/12 only.  Avelox 07/30/12>>>  HPI/Subjective: Feels much better today.   Objective: Vital signs in last 24 hours: Temp:  [98.3 F (36.8 C)-98.7 F (37.1 C)] 98.7 F (37.1 C) (10/27 1028) Pulse Rate:  [89-114] 114  (10/27 1028) Resp:  [18-20] 20  (10/27 1028) BP: (99-109)/(51-57) 109/53 mmHg (10/27 1028) SpO2:  [92 %-95 %] 93 % (10/27 1028) Weight change:  Last BM Date: 07/29/12  Intake/Output from previous day: 10/26 0701 - 10/27 0700 In: 3038.3 [P.O.:480; I.V.:2308.3; IV Piggyback:250] Out: -  Total I/O In: 400 [I.V.:400] Out: -    Physical Exam: General: Comfortable. Not short of breath at rest, able to talk in complete sentences, fully oriented.  HEENT:  No clinical pallor, no jaundice, no conjunctival injection or discharge. NECK:  Supple, JVP not seen, no carotid bruits, no palpable lymphadenopathy, no palpable goiter. CHEST:  Clinically clear to auscultation, no wheeze, no crackles. Marland Kitchen HEART:  Sounds 1 and 2 heard, normal, regular, no murmurs. ABDOMEN:  Flat, soft, non-tender, no palpable organomegaly, no palpable masses, normal bowel sounds. GENITALIA:  Not examined. LOWER EXTREMITIES:  No pitting edema, palpable peripheral pulses. MUSCULOSKELETAL SYSTEM:  Unremarkable. CENTRAL NERVOUS SYSTEM:  No focal neurologic  deficit on gross examination.  Lab Results:  Basename 07/31/12 0645 07/30/12  0451  WBC 19.0* 11.9*  HGB 12.4 13.3  HCT 38.6 39.9  PLT 204 191    Basename 07/31/12 0645 07/30/12 0451  NA 138 137  K 4.1 4.0  CL 107 104  CO2 22 19  GLUCOSE 130* 260*  BUN 12 12  CREATININE 0.58 0.71  CALCIUM 8.3* 9.2   No results found for this or any previous visit (from the past 240 hour(s)).   Studies/Results: Dg Chest Portable 1 View  07/30/2012  *RADIOLOGY REPORT*  Clinical Data: Shortness of breath, asthma  PORTABLE CHEST - 1 VIEW  Comparison: 05/29/2010  Findings: Mild bronchitic changes.  No focal consolidation.  No pleural effusion or pneumothorax.  Cardiomediastinal silhouette is within normal limits.  IMPRESSION: Mild bronchitic changes.   Original Report Authenticated By: Charline Bills, M.D.     Medications: Scheduled Meds:    . ipratropium  0.5 mg Nebulization Q4H   And  . albuterol  2.5 mg Nebulization Q4H  . enoxaparin (LOVENOX) injection  40 mg Subcutaneous Q24H  . guaiFENesin  600 mg Oral BID  . influenza  inactive virus vaccine  0.5 mL Intramuscular Tomorrow-1000  . insulin aspart  0-9 Units Subcutaneous TID WC  . insulin aspart  8 Units Subcutaneous Once  . methylPREDNISolone (SOLU-MEDROL) injection  60 mg Intravenous Q6H  . moxifloxacin  400 mg Intravenous Q24H   Continuous Infusions:    . sodium chloride 100 mL/hr at 07/30/12 1837   PRN Meds:.acetaminophen, acetaminophen, albuterol, alum & mag hydroxide-simeth, HYDROmorphone (DILAUDID) injection, ondansetron (ZOFRAN) IV, ondansetron, oxyCODONE, zolpidem, DISCONTD: albuterol    LOS: 1 day   Skylier Kretschmer,CHRISTOPHER  [facility] Pager [phone]. If 8PM-8AM, please contact night-coverage at [email or web address], password [facility] 07/31/2012, 2:29 PM  LOS: 1 day

## 2012-08-01 LAB — BASIC METABOLIC PANEL
BUN: 11 mg/dL (ref 6–23)
Calcium: 8.6 mg/dL (ref 8.4–10.5)
Chloride: 104 mEq/L (ref 96–112)
Creatinine, Ser: 0.6 mg/dL (ref 0.50–1.10)
GFR calc Af Amer: >90 mL/min (ref >90)

## 2012-08-01 LAB — GLUCOSE, CAPILLARY
Glucose-Capillary: 138 mg/dL — ABNORMAL HIGH (ref 70–99)
Glucose-Capillary: 143 mg/dL — ABNORMAL HIGH (ref 70–99)
Glucose-Capillary: 170 mg/dL — ABNORMAL HIGH (ref 70–99)
Glucose-Capillary: 125 mg/dL — ABNORMAL HIGH (ref 70–99)

## 2012-08-01 LAB — CBC
HCT: 40.7 % (ref 36.0–46.0)
MCH: 28.5 pg (ref 26.0–34.0)
MCV: 88.5 fL (ref 78.0–100.0)
RDW: 13.5 % (ref 11.5–15.5)
WBC: 13.7 10*3/uL — ABNORMAL HIGH (ref 4.0–10.5)

## 2012-08-01 MED ORDER — MOXIFLOXACIN HCL 400 MG PO TABS
400.0000 mg | ORAL_TABLET | Freq: Every day | ORAL | Status: DC
  Filled 2012-08-01: qty 1

## 2012-08-01 MED ORDER — PREDNISONE 20 MG PO TABS
40.0000 mg | ORAL_TABLET | Freq: Every day | ORAL | Status: DC
  Administered 2012-08-01 – 2012-08-02 (×2): 40 mg via ORAL
  Filled 2012-08-01 (×3): qty 2

## 2012-08-01 NOTE — Progress Notes (Signed)
RT Note: Patient had asked about continuing her Advair DPI while here in the hospital. Dr. Brien Few was paged this afternoon regarding that and stated that since the patient is currently receiving oral steroids, he does not wish to continue it on her Advair here as an inpatient but will re start it upon her discharge. RT conveyed that information to the patient as well and answerered any questions she had. Rt will continue to monitor.

## 2012-08-01 NOTE — Progress Notes (Signed)
Utilization review completed.  

## 2012-08-01 NOTE — ED Provider Notes (Signed)
Medical screening examination/treatment/procedure(s) were performed by non-physician practitioner and as supervising physician I was immediately available for consultation/collaboration.  Leslee Home, M.D.   Reuben Likes, MD 08/01/12 [phone]

## 2012-08-01 NOTE — Discharge Summary (Signed)
Physician Discharge Summary  Terri Howard ZOX:[id number] DOB: [birth year] DOA: 07/30/2012  PCP: Pcp Not In System  Admit date: 07/30/2012 Discharge date: 08/02/2012  Time spent: 35 minutes  Recommendations for Outpatient Follow-up:  1. Follow up with Dr Sandrea Hughs, Pulmonologist.   Discharge Diagnoses:  Principal Problem:  *Status asthmaticus Active Problems:  SOB (shortness of breath)  Hypoxemia  Acute respiratory distress  Acute bronchitis  Hyperglycemia   Discharge Condition: Satisfactory.  Diet recommendation: Regular.   Filed Weights   07/30/12 0710 07/30/12 0734  Weight: 53.6 kg (118 lb 2.7 oz) 54.3 kg (119 lb 11.4 oz)    History of present illness:  21 y.o. female with a history of Asthma, presenting with worsening SOB, chest tightness, and wheezing over the past 48 hours. She presented to the ED, and then to the [facility]. She would have temporary relief , following nebulizer treatment, and then relapse. When she returned to the ED later, she was found to have an O2 saturation of 88%. She denied having fevers or chills but does report coughing up yellow sputum. She was admitted for further management.    Hospital Course:  1. Acute severe asthma: This patient with known history of bronchial asthma, presented with 48 hours of worsening SOB, wheeze and productive cough, poorly responsive to multiple bronchodilator treatments. She was managed with iv antibiotics, parenteral steroids, mucolytics, bronchodilators and oxygen supplementation, with steady and satisfactory clinical imptovement. Patient was transitioned to oral steroid taper from 08/01/12, without deleterious effect. As patient has difficult-to-control asthma, we have set up pulmonary follow up with Dr Sandrea Hughs, for 08/04/12, at 10:00 AM.  2. Acute bronchitis: Patient has had a cough, productive of yellow phlegm for about 4 days prior to presentation, and her sick contact is her son, who is just recovering from a  cold. CXR revealed bronchitic changes, but no pneumonic consolidation. These are features of acute bronchitis, which is the likely precipitant for her acute asthma attack. She was managed as described above, and is to conclude a 7-day course of Avelox, on 08/05/12.  3. Respiratory failure. In the ED, patient was found to have an O2 saturation of 88%, consistent with acute hypoxic respiratory failure, secondary to #s 1 & 2 above. Improvement occurred with specific management of underlying conditions. As of 08/01/12, patient was saturating at 93%-95% on 1-2L, and on 08/02/12, at 96% on room air.  4. Hyperglycemia: Random Glucose was significantly elevated at 209-260, at presentation. Patient has no known previous history of DM. This is steroid induced, as patient was on oral steroids pre-admission. She was managed with SSI, and CBGs have normalized. HBA1C is 5.3. Patient has been reassured accordingly.    Procedures:  See below.   Consultations:  N/A.   Discharge Exam: Filed Vitals:   08/02/12 0137 08/02/12 0354 08/02/12 0849 08/02/12 1002  BP:  112/57  113/62  Pulse:  56  73  Temp:  98.2 F (36.8 C)  98.1 F (36.7 C)  TempSrc:  Oral  Oral  Resp:  16  18  Height:      Weight:      SpO2: 98% 96% 95% 98%    General: Comfortable. Not short of breath at rest, able to talk in complete sentences, fully oriented.  HEENT: No clinical pallor, no jaundice, no conjunctival injection or discharge.  NECK: Supple, JVP not seen, no carotid bruits, no palpable lymphadenopathy, no palpable goiter.  CHEST: Clinically clear, no wheeze, no crackles.   HEART: Sounds 1 and  2 heard, normal, regular, no murmurs.  ABDOMEN: Flat, soft, non-tender, no palpable organomegaly, no palpable masses, normal bowel sounds.  GENITALIA: Not examined.  LOWER EXTREMITIES: No pitting edema, palpable peripheral pulses.  MUSCULOSKELETAL SYSTEM: Unremarkable.  CENTRAL NERVOUS SYSTEM: No focal neurologic deficit on gross  examination.  Discharge Instructions      Discharge Orders    Future Appointments: Provider: Department: Dept Phone: Center:   08/04/2012 10:00 AM Nyoka Cowden, MD [facility] [phone] None     Future Orders Please Complete By Expires   Diet general      Increase activity slowly          Medication List     As of 08/02/2012 12:43 PM    TAKE these medications         albuterol 108 (90 BASE) MCG/ACT inhaler   Commonly known as: PROVENTIL HFA;VENTOLIN HFA   Inhale 2 puffs into the lungs every 4 (four) hours as needed. For asthma      cetirizine 10 MG chewable tablet   Commonly known as: ZYRTEC   Chew 1 tablet (10 mg total) by mouth daily.      Fluticasone-Salmeterol 250-50 MCG/DOSE Aepb   Commonly known as: ADVAIR   Inhale 1 puff into the lungs every 12 (twelve) hours.      guaiFENesin 600 MG 12 hr tablet   Commonly known as: MUCINEX   Take 1 tablet (600 mg total) by mouth 2 (two) times daily.      moxifloxacin 400 MG tablet   Commonly known as: AVELOX   Take 1 tablet (400 mg total) by mouth daily at 8 pm.      predniSONE 10 MG tablet   Commonly known as: DELTASONE   Take 40 mg daily for 3 days, then 30 mg daily for 3 days, then 20 mg daily for 3 days, then 10 mg daily for 3 days, then stop.        Follow-up Information    Follow up with Sandrea Hughs, MD. (You have appointment on 11/31/13 at 10:00 AM)    Contact information:   [address] [place] [address] [phone]           The results of significant diagnostics from this hospitalization (including imaging, microbiology, ancillary and laboratory) are listed below for reference.    Significant Diagnostic Studies: Dg Chest Portable 1 View  07/30/2012  *RADIOLOGY REPORT*  Clinical Data: Shortness of breath, asthma  PORTABLE CHEST - 1 VIEW  Comparison: 05/29/2010  Findings: Mild bronchitic changes.  No focal consolidation.  No pleural effusion or  pneumothorax.  Cardiomediastinal silhouette is within normal limits.  IMPRESSION: Mild bronchitic changes.   Original Report Authenticated By: Charline Bills, M.D.     Microbiology: No results found for this or any previous visit (from the past 240 hour(s)).   Labs: Basic Metabolic Panel:  Lab 08/02/12 2951 08/01/12 0655 07/31/12 0645 07/30/12 0451 07/30/12 0121  NA 139 136 138 137 136  K 3.3* 3.8 4.1 4.0 3.9  CL 106 104 107 104 101  CO2 24 23 22 19 21   GLUCOSE 92 144* 130* 260* 209*  BUN 13 11 12 12 11   CREATININE 0.69 0.60 0.58 0.71 0.68  CALCIUM 8.2* 8.6 8.3* 9.2 9.5  MG -- -- -- -- --  PHOS -- -- -- -- --   Liver Function Tests:  Lab 07/31/12 0645  AST 21  ALT 15  ALKPHOS 74  BILITOT 0.2*  PROT 6.4  ALBUMIN 3.2*   No results found for this basename: LIPASE:5,AMYLASE:5 in the last 168 hours No results found for this basename: AMMONIA:5 in the last 168 hours CBC:  Lab 08/02/12 0558 08/01/12 0655 07/31/12 0645 07/30/12 0451 07/30/12 0121  WBC 10.5 13.7* 19.0* 11.9* 8.7  NEUTROABS -- -- -- -- 7.8*  HGB 12.4 13.1 12.4 13.3 14.2  HCT 38.4 40.7 38.6 39.9 42.0  MCV 87.9 88.5 87.5 86.4 85.9  PLT 204 217 204 191 194   Cardiac Enzymes: No results found for this basename: CKTOTAL:5,CKMB:5,CKMBINDEX:5,TROPONINI:5 in the last 168 hours BNP: BNP (last 3 results) No results found for this basename: PROBNP:3 in the last 8760 hours CBG:  Lab 08/02/12 1157 08/02/12 0737 08/01/12 2142 08/01/12 1729 08/01/12 1157  GLUCAP 93 90 125* 170* 143*       Signed:  Tyden Kann,CHRISTOPHER  [facility] 08/02/2012, 12:43 PM

## 2012-08-01 NOTE — Progress Notes (Signed)
[facility] PROGRESS NOTE  Henchy Mccauley QMV:[id number] DOB: 1991/01/01 DOA: 07/30/2012 PCP: Pcp Not In System  Assessment/Plan: Principal Problem:  *Status asthmaticus Active Problems:  SOB (shortness of breath)  Hypoxemia  Acute respiratory distress  Acute bronchitis  Hyperglycemia   1. Acute severe asthma: This patient with known history of bronchial asthma, presented with 48 hours of worsening SOB, wheeze and productive cough, poorly responsive to multiple bronchodilator treatments. Commenced on iv antibiotics, parenteral steroids, mucolytics, bronchodilators and oxygen supplementation. Clinical improvement has been steady and satisfactory, and patient feels considerably better today. Have started oral steroid taper from 08/01/12. As patient has difficult-to-control asthma, have set up pulmonary follow up with Dr Sandrea Hughs, for 08/04/12, at 10:00 AM. Aiming discharge on 08/02/12.  2. Acute bronchitis: Patient has had a cough, productive of yellow phlegm for about 4 days prior to presentation, and her sick contact is her son, who is just recovering from a cold. CXR revealed bronchitic changes, but no pneumonic consolidation. These are features of acute bronchitis, which is the likely precipitant for her acute asthma attack. Managing as described above. On Avelox, day# 3/7.  3. Respiratory failure. In the ED, patient was found to have an O2 saturation of 88%, consistent with acute hypoxic respiratory failure, secondary to #s 1 & 2 above. Improvement is anticipated with specific management of underlying conditions. Patient is currently saturating at 93%-95% on 1-2L.  4. Hyperglycemia: Random Glucose was significantly elevated at 209-260. Patient has no known previous history of DM. This is steroid induced, as patient was on oral steroids, pre-admission. Managing with SSI, and CBGs have normalized. HBA1C is 5.3. Patient has been reassured accordingly.   Code Status: Full Code.  Family  Communication:  Disposition Plan: Aiming discharge on 08/02/12.   Brief narrative: 21 y.o. female with a history of Asthma, presenting with worsening SOB, chest tightness, and wheezing over the past 48 hours. She presented to the ED, and then to the [facility]. She would have temporary relief , following nebulizer treatment, and then relapse. When she returned to the ED later, she was found to have an O2 saturation of 88%. She denied having fevers or chills but does report coughing up yellow sputum. She was admitted for further management.     Consultants:  N/A.  Procedures:  CXR.   Antibiotics:  Azithromycin 07/30/12 only.  Avelox 07/30/12>>>  HPI/Subjective: Better.   Objective: Vital signs in last 24 hours: Temp:  [97.5 F (36.4 C)-98.8 F (37.1 C)] 97.9 F (36.6 C) (10/28 0839) Pulse Rate:  [65-91] 86  (10/28 0839) Resp:  [18-20] 18  (10/28 0839) BP: (106-120)/(56-81) 118/56 mmHg (10/28 0839) SpO2:  [92 %-98 %] 94 % (10/28 0839) Weight change:  Last BM Date: 07/31/12  Intake/Output from previous day: 10/27 0701 - 10/28 0700 In: 400 [I.V.:400] Out: -      Physical Exam: General: Comfortable. Not short of breath at rest, able to talk in complete sentences, fully oriented.  HEENT:  No clinical pallor, no jaundice, no conjunctival injection or discharge. NECK:  Supple, JVP not seen, no carotid bruits, no palpable lymphadenopathy, no palpable goiter. CHEST:  Few  wheezes, no crackles. Marland Kitchen HEART:  Sounds 1 and 2 heard, normal, regular, no murmurs. ABDOMEN:  Flat, soft, non-tender, no palpable organomegaly, no palpable masses, normal bowel sounds. GENITALIA:  Not examined. LOWER EXTREMITIES:  No pitting edema, palpable peripheral pulses. MUSCULOSKELETAL SYSTEM:  Unremarkable. CENTRAL NERVOUS SYSTEM:  No focal neurologic deficit on gross examination.  Lab Results:  Basename 08/01/12 0655 07/31/12 0645  WBC 13.7* 19.0*  HGB 13.1 12.4  HCT 40.7 38.6  PLT 217 204     Basename 08/01/12 0655 07/31/12 0645  NA 136 138  K 3.8 4.1  CL 104 107  CO2 23 22  GLUCOSE 144* 130*  BUN 11 12  CREATININE 0.60 0.58  CALCIUM 8.6 8.3*   No results found for this or any previous visit (from the past 240 hour(s)).   Studies/Results: No results found.  Medications: Scheduled Meds:    . ipratropium  0.5 mg Nebulization Q6H   And  . albuterol  2.5 mg Nebulization Q6H  . enoxaparin (LOVENOX) injection  40 mg Subcutaneous Q24H  . guaiFENesin  600 mg Oral BID  . insulin aspart  0-9 Units Subcutaneous TID WC  . methylPREDNISolone (SOLU-MEDROL) injection  60 mg Intravenous Q6H  . moxifloxacin  400 mg Intravenous Q24H  . DISCONTD: albuterol  2.5 mg Nebulization Q4H  . DISCONTD: ipratropium  0.5 mg Nebulization Q4H   Continuous Infusions:    . DISCONTD: sodium chloride 100 mL/hr at 07/30/12 1837   PRN Meds:.acetaminophen, acetaminophen, albuterol, alum & mag hydroxide-simeth, HYDROmorphone (DILAUDID) injection, ondansetron (ZOFRAN) IV, ondansetron, oxyCODONE, zolpidem    LOS: 2 days   Orine Goga,CHRISTOPHER  [facility] Pager [phone]. If 8PM-8AM, please contact night-coverage at [email or web address], password [facility] 08/01/2012, 11:01 AM  LOS: 2 days

## 2012-08-02 DIAGNOSIS — J45909 Unspecified asthma, uncomplicated: Secondary | ICD-10-CM

## 2012-08-02 LAB — CBC
MCV: 87.9 fL (ref 78.0–100.0)
Platelets: 204 10*3/uL (ref 150–400)
RBC: 4.37 MIL/uL (ref 3.87–5.11)
WBC: 10.5 10*3/uL (ref 4.0–10.5)

## 2012-08-02 LAB — BASIC METABOLIC PANEL
CO2: 24 mEq/L (ref 19–32)
Chloride: 106 mEq/L (ref 96–112)
Potassium: 3.3 mEq/L — ABNORMAL LOW (ref 3.5–5.1)
Sodium: 139 mEq/L (ref 135–145)

## 2012-08-02 LAB — GLUCOSE, CAPILLARY: Glucose-Capillary: 90 mg/dL (ref 70–99)

## 2012-08-02 MED ORDER — POTASSIUM CHLORIDE CRYS ER 20 MEQ PO TBCR
40.0000 meq | EXTENDED_RELEASE_TABLET | ORAL | Status: AC
  Administered 2012-08-02: 40 meq via ORAL
  Filled 2012-08-02: qty 2

## 2012-08-02 MED ORDER — MOXIFLOXACIN HCL 400 MG PO TABS: 400.0000 mg | ORAL_TABLET | Freq: Every day | ORAL | Status: DC

## 2012-08-02 MED ORDER — ALBUTEROL SULFATE HFA 108 (90 BASE) MCG/ACT IN AERS: 2.0000 | INHALATION_SPRAY | RESPIRATORY_TRACT | Status: DC | PRN

## 2012-08-02 MED ORDER — GUAIFENESIN ER 600 MG PO TB12: 600.0000 mg | ORAL_TABLET | Freq: Two times a day (BID) | ORAL | Status: DC

## 2012-08-02 MED ORDER — PREDNISONE 10 MG PO TABS: ORAL_TABLET | ORAL | Status: DC

## 2012-08-02 NOTE — Progress Notes (Signed)
Pt. discharge to floor,verbalized understanding of discharged instruction,medication,restriction,diet and follow up appointment.Baseline Vitals sign stable,Pt comfortable,no sign and symptom of distress. 

## 2012-08-02 NOTE — Care Management Note (Signed)
    Page 1 of 1   08/02/2012     12:32:17 PM   CARE MANAGEMENT NOTE 08/02/2012  Patient:  Terri Howard, Terri Howard   Account Number:  [id number]  Date Initiated:  08/02/2012  Documentation initiated by:  Letha Cape  Subjective/Objective Assessment:   dx asthmaticus  admit- pta independent.     Action/Plan:   Anticipated DC Date:  08/02/2012   Anticipated DC Plan:  HOME/SELF CARE      DC Planning Services  CM consult      Choice offered to / List presented to:             Status of service:  Completed, signed off Medicare Important Message given?   (If response is "NO", the following Medicare IM given date fields will be blank) Date Medicare IM given:   Date Additional Medicare IM given:    Discharge Disposition:  HOME/SELF CARE  Per UR Regulation:  Reviewed for med. necessity/level of care/duration of stay  If discussed at Long Length of Stay Meetings, dates discussed:    Comments:  08/02/12 12:30 Letha Cape RN, BSN [phone] patient for dc today, pta independent,   patient has meidcation coverage and transportation,no needs identified.

## 2012-08-04 ENCOUNTER — Ambulatory Visit (INDEPENDENT_AMBULATORY_CARE_PROVIDER_SITE_OTHER): Payer: BC Managed Care – PPO | Admitting: Internal Medicine

## 2012-08-04 ENCOUNTER — Encounter: Payer: Self-pay | Admitting: Internal Medicine

## 2012-08-04 VITALS — BP 102/60 | HR 72 | Temp 97.7°F | Ht 66.0 in | Wt 121.4 lb

## 2012-08-04 DIAGNOSIS — J45909 Unspecified asthma, uncomplicated: Secondary | ICD-10-CM

## 2012-08-04 NOTE — Patient Instructions (Addendum)
dulera 200 Take 2 puffs first thing in am and then another 2 puffs about 12 hours later.   Taper off prednisone   Try prilosec 20mg   Take 30-60 min before first meal of the day and Pepcid 20 mg one bedtime until cough is completely gone for at least a week without the need for cough suppression  GERD (REFLUX)  is an extremely common cause of respiratory symptoms, many times with no significant heartburn at all.    It can be treated with medication, but also with lifestyle changes including avoidance of late meals, excessive alcohol, smoking cessation, and avoid fatty foods, chocolate, peppermint, colas, red wine, and acidic juices such as orange juice.  NO MINT OR MENTHOL PRODUCTS SO NO COUGH DROPS  USE SUGARLESS CANDY INSTEAD (jolley ranchers or Stover's)  NO OIL BASED VITAMINS - use powdered substitutes.  Only use your albuterol(Plan B = puffer ventolin,  Plan C Nebulizer  as a rescue medication to be used if you can't catch your breath by resting or doing a relaxed purse lip breathing pattern. The less you use it, the better it will work when you need it.  You can use albuterol up to every 4 hours with goal of less than twice weekly.   Please schedule a follow up office visit in 2 weeks, sooner if needed

## 2012-08-04 NOTE — Progress Notes (Signed)
  Subjective:    Patient ID: Terri Howard, female    DOB: [birth year]  MRN: [id number]  HPI  Brief patient profile:  34 yobf never smoker "asthma all her life" always difficult to control and 2 previous hosp in [place] eval by allergist in [place] "allergic to everything" on shots until 2008 couldn't tell they helped much but moved to [facility] in 2008 stopped shots (still  requiring er and hosp while on shots so not clear then helped) referred to pulmonary clinic by Dr Brien Few p admit [facility]:  Admit date: 07/30/2012  Discharge date: 08/02/2012    Discharge Diagnoses:  Principal Problem:  *Status asthmaticus  Active Problems:  SOB (shortness of breath)  Hypoxemia  Acute respiratory distress  Acute bronchitis  Hyperglycemia    08/04/2012 1st pulmonary eval/ Terri Howard cc breathing some better but  still on prednisone taper cc  daily symptoms of sob with > slow adls using saba twice daily hfa albertol and used neb 1 am and ventolin around 8 am on day of 1st ov at 10:30am with prominent mostly dry coughing assoc with chest tightness and subjective wheeze as well as nasal congestion and  Sneezing.  Does not recognize specific triggers but some worse with season changes  Denies any obvious fluctuation of symptoms with weather or environmental changes or other aggravating or alleviating factors except as outlined above   Review of Systems  Constitutional: Negative for fever, chills and unexpected weight change.  HENT: Positive for congestion, sore throat and sneezing. Negative for ear pain, nosebleeds, rhinorrhea, trouble swallowing, dental problem, voice change, postnasal drip and sinus pressure.   Eyes: Negative for visual disturbance.  Respiratory: Positive for cough and shortness of breath. Negative for choking.   Cardiovascular: Positive for chest pain. Negative for leg swelling.  Gastrointestinal: Negative for vomiting, abdominal pain and diarrhea.  Genitourinary: Negative for  difficulty urinating.  Musculoskeletal: Negative for arthralgias.  Skin: Negative for rash.  Neurological: Negative for tremors, syncope and headaches.  Hematological: Does not bruise/bleed easily.       Objective:   Physical Exam amb thin bf nad Wt 121 08/05/2012  HEENT mild turbinate edema.  Oropharynx no thrush or excess pnd or cobblestoning.  No JVD or cervical adenopathy. Mild accessory muscle hypertrophy. Trachea midline, nl thryroid. Chest was hyperinflated by percussion with diminished breath sounds and mildincreased exp time with trace bilateral mid to late exp wheeze. Hoover sign positive at end inspiration. Regular rate and rhythm without murmur gallop or rub or increase P2 or edema.  Abd: no hsm, nl excursion. Ext warm without cyanosis or clubbing.    pcxr 07/30/12 Mild bronchitic changes      Assessment & Plan:

## 2012-08-05 NOTE — Assessment & Plan Note (Signed)
DDX of  difficult airways managment all start with A and  include Adherence, Ace Inhibitors, Acid Reflux, Active Sinus Disease, Alpha 1 Antitripsin deficiency, Anxiety masquerading as Airways dz,  ABPA,  allergy(esp in young), Aspiration (esp in elderly), Adverse effects of DPI,  Active smokers, plus two Bs  = Bronchiectasis and Beta blocker use..and one C= CHF  Adherence is always the initial "prime suspect" and is a multilayered concern that requires a "trust but verify" approach in every patient - starting with knowing how to use medications, especially inhalers, correctly, keeping up with refills and understanding the fundamental difference between maintenance and prns vs those medications only taken for a very short course and then stopped and not refilled. The proper method of use, as well as anticipated side effects, of a metered-dose inhaler are discussed and demonstrated to the patient. Improved effectiveness after extensive coaching during this visit to a level of approximately  75%  rx dulera 200 2bid  ? Allergy > no better previously on shots.

## 2012-08-16 ENCOUNTER — Telehealth: Payer: Self-pay | Admitting: Internal Medicine

## 2012-08-16 MED ORDER — MOMETASONE FURO-FORMOTEROL FUM 200-5 MCG/ACT IN AERO: 2.0000 | INHALATION_SPRAY | Freq: Every day | RESPIRATORY_TRACT | Status: DC

## 2012-08-16 NOTE — Telephone Encounter (Signed)
Pt returned triage's call.  Terri Howard ° °

## 2012-08-16 NOTE — Telephone Encounter (Signed)
Patient returned call, states she misplaced dulera 200 sample.  Dulera placed upfront for patient pickup and patient is aware.  Nothing further needed at this time.

## 2012-08-16 NOTE — Telephone Encounter (Signed)
ATC patient, no answer [id number] 

## 2012-08-19 ENCOUNTER — Encounter: Payer: Self-pay | Admitting: Adult Health

## 2012-08-19 ENCOUNTER — Ambulatory Visit (INDEPENDENT_AMBULATORY_CARE_PROVIDER_SITE_OTHER): Payer: BC Managed Care – PPO | Admitting: Adult Health

## 2012-08-19 VITALS — BP 112/66 | HR 64 | Temp 97.9°F | Ht 65.0 in | Wt 123.0 lb

## 2012-08-19 DIAGNOSIS — J45909 Unspecified asthma, uncomplicated: Secondary | ICD-10-CM

## 2012-08-19 MED ORDER — MOMETASONE FURO-FORMOTEROL FUM 200-5 MCG/ACT IN AERO: 2.0000 | INHALATION_SPRAY | Freq: Every day | RESPIRATORY_TRACT | Status: DC

## 2012-08-19 NOTE — Patient Instructions (Signed)
Continue on Dulera  2 puffs twice daily, brush, rinse  and gargle after use. Begin Zyrtec 10 mg each bedtime as needed. For postnasal drip, drainage, and tickle in throat. Begin Prilosec 20 mg daily before meal x2 weeks then as needed. For any heartburn symptoms.  Followup in 4 months and as needed

## 2012-08-19 NOTE — Addendum Note (Signed)
Addended by: Nita Sells on: 08/19/2012 03:04 PM   Modules accepted: Orders

## 2012-08-19 NOTE — Assessment & Plan Note (Signed)
Improved control on Dulera Control triggers of AR   Plan Continue on Dulera  2 puffs twice daily, brush, rinse  and gargle after use. Begin Zyrtec 10 mg each bedtime as needed. For postnasal drip, drainage, and tickle in throat. Begin Prilosec 20 mg daily before meal x2 weeks then as needed. For any heartburn symptoms.  Followup in 4 months and as needed

## 2012-08-19 NOTE — Progress Notes (Signed)
  Subjective:    Patient ID: Terri Howard, female    DOB: [birth year]  MRN: [id number]  HPI  Brief patient profile:  26 yobf never smoker "asthma all her life" always difficult to control and 2 previous hosp in [place] eval by allergist in [place] "allergic to everything" on shots until 2008 couldn't tell they helped much but moved to [facility] in 2008 stopped shots (still  requiring er and hosp while on shots so not clear then helped) referred to pulmonary clinic by Dr Brien Few p admit [facility]:  Admit date: 07/30/2012  Discharge date: 08/02/2012    Discharge Diagnoses:  Principal Problem:  *Status asthmaticus  Active Problems:  SOB (shortness of breath)  Hypoxemia  Acute respiratory distress  Acute bronchitis  Hyperglycemia    08/04/2012 1st pulmonary eval/ Wert cc breathing some better but  still on prednisone taper cc  daily symptoms of sob with > slow adls using saba twice daily hfa albertol and used neb 1 am and ventolin around 8 am on day of 1st ov at 10:30am with prominent mostly dry coughing assoc with chest tightness and subjective wheeze as well as nasal congestion and  Sneezing.  Does not recognize specific triggers but some worse with season changes >>stopped Advair , begin Dulera  08/19/2012 Follow up  Returns for 2 weeks follow up for asthma Started on Dulera last ov. Advair discontinued.  Reports breathing is much improved with the dulera, no longer wheezing.   finished the pred taper 6days ago and did not pick up the prilosec or pepcid. No SABA in 2 weeks.  Best she has ever been.     Review of Systems  Constitutional:   No  weight loss, night sweats,  Fevers, chills, fatigue, or  lassitude.  HEENT:   No headaches,  Difficulty swallowing,  Tooth/dental problems, or  Sore throat,                No sneezing, itching, ear ache,  +nasal congestion, post nasal drip,   CV:  No chest pain,  Orthopnea, PND, swelling in lower extremities, anasarca, dizziness,  palpitations, syncope.   GI  No heartburn, indigestion, abdominal pain, nausea, vomiting, diarrhea, change in bowel habits, loss of appetite, bloody stools.   Resp: No shortness of breath with exertion or at rest.  No excess mucus, no productive cough,  No non-productive cough,  No coughing up of blood.  No change in color of mucus.  No wheezing.  No chest wall deformity  Skin: no rash or lesions.  GU: no dysuria, change in color of urine, no urgency or frequency.  No flank pain, no hematuria   MS:  No joint pain or swelling.  No decreased range of motion.  No back pain.  Psych:  No change in mood or affect. No depression or anxiety.  No memory loss.        Objective:   Physical Exam amb thin bf nad Wt 121 08/05/2012 >123 08/19/2012  HEENT mild turbinate edema.  Oropharynx no thrush or excess pnd or cobblestoning.  No JVD or cervical adenopathy. Mild accessory muscle hypertrophy. Trachea midline, nl thryroid. Chest   CTA  Regular rate and rhythm without murmur gallop or rub or increase P2 or edema.  Abd: no hsm, nl excursion. Ext warm without cyanosis or clubbing.    pcxr 07/30/12 Mild bronchitic changes      Assessment & Plan:

## 2012-10-06 ENCOUNTER — Encounter: Payer: Self-pay | Admitting: Obstetrics and Gynecology

## 2012-10-11 ENCOUNTER — Ambulatory Visit (INDEPENDENT_AMBULATORY_CARE_PROVIDER_SITE_OTHER): Payer: BC Managed Care – PPO | Admitting: Obstetrics and Gynecology

## 2012-10-11 ENCOUNTER — Encounter: Payer: Self-pay | Admitting: Obstetrics and Gynecology

## 2012-10-11 VITALS — BP 90/62 | HR 68 | Ht 65.0 in | Wt 125.0 lb

## 2012-10-11 DIAGNOSIS — Z01419 Encounter for gynecological examination (general) (routine) without abnormal findings: Secondary | ICD-10-CM

## 2012-10-11 DIAGNOSIS — Z975 Presence of (intrauterine) contraceptive device: Secondary | ICD-10-CM

## 2012-10-11 DIAGNOSIS — Z113 Encounter for screening for infections with a predominantly sexual mode of transmission: Secondary | ICD-10-CM

## 2012-10-11 DIAGNOSIS — Z124 Encounter for screening for malignant neoplasm of cervix: Secondary | ICD-10-CM

## 2012-10-11 LAB — RPR

## 2012-10-11 LAB — HEPATITIS B SURFACE ANTIGEN: Hepatitis B Surface Ag: NEGATIVE

## 2012-10-11 LAB — HEPATITIS C ANTIBODY: HCV Ab: NEGATIVE

## 2012-10-11 MED ORDER — NYSTATIN-TRIAMCINOLONE 100000-0.1 UNIT/GM-% EX OINT: TOPICAL_OINTMENT | Freq: Two times a day (BID) | CUTANEOUS | Status: DC

## 2012-10-11 NOTE — Progress Notes (Addendum)
Subjective:    Terri Howard is a 22 y.o. female, G1P1001, who presents for an annual exam. The patient reports episodic vaginal itching.  Menstrual cycle:   LMP: Patient's last menstrual period was 10/02/2012.             Review of Systems Pertinent items are noted in HPI. Denies pelvic pain, urinary tract symptoms, vaginitis symptoms, irregular bleeding, menopausal symptoms, change in bowel habits or rectal bleeding   Objective:    BP 90/62  Pulse 68  Ht 5\' 5"  (1.651 m)  Wt 125 lb (56.7 kg)  BMI 20.80 kg/m2  LMP 10/02/2012   Wt Readings from Last 1 Encounters:  10/11/12 125 lb (56.7 kg)   Body mass index is 20.80 kg/(m^2). General Appearance: Alert, no acute distress HEENT: Grossly normal Neck / Thyroid: Supple, no thyromegaly or cervical adenopathy Lungs: Clear to auscultation bilaterally Back: No CVA tenderness Breast Exam: No masses or nodes.No dimpling, nipple retraction or discharge. Cardiovascular: Regular rate and rhythm.  Gastrointestinal: Soft, non-tender, no masses or organomegaly Pelvic Exam: EGBUS-wnl, vagina-normal rugae, cervix-string visible  without lesions or tenderness, uterus appears normal size shape and consistency, adnexae-no masses or tenderness Lymphatic Exam: Non-palpable nodes in neck, clavicular,  axillary, or inguinal regions  Skin: no rashes or abnormalities Extremities: no clubbing cyanosis or edema  Neurologic: grossly normal Psychiatric: Alert and oriented  UPT: negative   Assessment:   Routine GYN Exam   Plan:  Mycolog II Cream #30 grams apply to affected area bid x 7-14 days no refills [Patient reports at end of visit that she's had intralabial itching with erythema from time to time]  Reviewed perineal hygiene  PAP sent  RTO 1 year or prn  Erinn Huskins,ELMIRAPA-C

## 2012-10-11 NOTE — Progress Notes (Signed)
Regular Periods: yes Mammogram: no u/s on left breast  Monthly Breast Ex.: yes Exercise: no  Tetanus < 10 years: yes Seatbelts: yes  NI. Bladder Functn.: yes Abuse at home: no  Daily BM's: no Stressful Work: yes  Healthy Diet: yes Sigmoid-Colonoscopy: no  Calcium: no Medical problems this year: none   LAST PAP:6/12  Contraception: IUD MIRENA  Mammogram:  NO  PCP: NO  PMH: NO CHANGE  FMH: NO CHANGE  Last Bone Scan: NO   PT IS SINGLE.

## 2012-10-11 NOTE — Patient Instructions (Signed)
Avoid: - excess soap on genital area (consider using plain oatmeal soap) - use of powder or sprays in genital area - douching - wearing underwear to bed (except with menses) - using more than is directed detergent when washing clothes - tight fitting garments around genital area - excess sugar intake   

## 2012-10-12 LAB — PAP IG, CT-NG, RFX HPV ASCU
Chlamydia Probe Amp: NEGATIVE
GC Probe Amp: NEGATIVE

## 2012-10-13 ENCOUNTER — Telehealth: Payer: Self-pay

## 2012-10-13 MED ORDER — FLUCONAZOLE 150 MG PO TABS: 150.0000 mg | ORAL_TABLET | Freq: Once | ORAL | Status: DC

## 2012-10-13 NOTE — Telephone Encounter (Signed)
Message copied by Winfred Leeds on Thu Oct 13, 2012 11:24 AM ------      Message from: Henreitta Leber      Created: Thu Oct 13, 2012  8:54 AM       Patient with yeast on PAP.  Offer her Diflucan 150 mg #1 1 po stat with 1 refill or Terazol 7 Vaginal #1 tube   1 applicatorful in vagina at bedtime daily x 7 days no refill.  She may continue to use the external cream prescribed at last visit.  POWELL,ELMIRA, PA-C

## 2012-10-13 NOTE — Telephone Encounter (Signed)
Tc to pt regarding pap test. Informed pt that her pap was normal however it did show yeast. Told pt that we need to call in rx for the yeast. Informed pt that all other test were wnl. Will call in rx for yeast to pt pharmacy and pt voiced understanding.

## 2012-12-19 ENCOUNTER — Ambulatory Visit: Payer: BC Managed Care – PPO | Admitting: Internal Medicine

## 2013-01-25 ENCOUNTER — Ambulatory Visit (INDEPENDENT_AMBULATORY_CARE_PROVIDER_SITE_OTHER): Payer: BC Managed Care – PPO | Admitting: Internal Medicine

## 2013-01-25 ENCOUNTER — Encounter: Payer: Self-pay | Admitting: Internal Medicine

## 2013-01-25 VITALS — BP 102/80 | HR 62 | Temp 97.3°F | Ht 65.0 in | Wt 130.0 lb

## 2013-01-25 DIAGNOSIS — J45909 Unspecified asthma, uncomplicated: Secondary | ICD-10-CM

## 2013-01-25 MED ORDER — MONTELUKAST SODIUM 10 MG PO TABS: 10.0000 mg | ORAL_TABLET | Freq: Every day | ORAL | Status: DC

## 2013-01-25 MED ORDER — MOMETASONE FURO-FORMOTEROL FUM 100-5 MCG/ACT IN AERO: INHALATION_SPRAY | RESPIRATORY_TRACT | Status: DC

## 2013-01-25 MED ORDER — ALBUTEROL SULFATE HFA 108 (90 BASE) MCG/ACT IN AERS: 2.0000 | INHALATION_SPRAY | Freq: Four times a day (QID) | RESPIRATORY_TRACT | Status: DC | PRN

## 2013-01-25 NOTE — Progress Notes (Signed)
Subjective:    Patient ID: Terri Howard, female    DOB: 1990/10/08  MRN: [id number]  HPI  Brief patient profile:  29 yobf never smoker "asthma all her life" always difficult to control and 2 previous hosp in [place] eval by allergist in [place] "allergic to everything" on shots until 2008 couldn't tell they helped much but moved to [facility] in 2008 stopped shots (still  requiring er and hosp while on shots so not clear then helped) referred to pulmonary clinic by Dr Brien Few p admit [facility]:  Admit date: 07/30/2012  Discharge date: 08/02/2012    Discharge Diagnoses:  Principal Problem:  *Status asthmaticus  Active Problems:  SOB (shortness of breath)  Hypoxemia  Acute respiratory distress  Acute bronchitis  Hyperglycemia    08/04/2012 1st pulmonary eval/ Maleek Craver cc breathing some better but  still on prednisone taper cc  daily symptoms of sob with > slow adls using saba twice daily hfa albertol and used neb 1 am and ventolin around 8 am on day of 1st ov at 10:30am with prominent mostly dry coughing assoc with chest tightness and subjective wheeze as well as nasal congestion and  Sneezing.  Does not recognize specific triggers but some worse with season changes >>stopped Advair , begin Dulera  08/19/2012 Follow up  Returns for 2 weeks follow up for asthma Started on Dulera last ov. Advair discontinued.  Reports breathing is much improved with the dulera, no longer wheezing.   finished the pred taper 6days ago and did not pick up the prilosec or pepcid. No SABA in 2 weeks.  Best she has ever been.     01/25/2013 f/u ov/Ellwyn Ergle for asthma Chief Complaint  Patient presents with  . Follow-up    Pt states her breathing is much improved. She states feels dulera is really helping, alot.    out of dulera x 2 weeks and beginning to feel a tight in evening.  Has no saba either hfa or neb. No sob, no cough. Does have some sinus congestion and eyes watering better with singulair in past but  tends to run out of meds when feels better and does not refill them   No obvious daytime variabilty or assoc chronic cough or cp or chest tightness, subjective wheeze overt   or hb symptoms. No unusual exp hx or h/o childhood pna/ asthma or premature birth to her knowledge.   Sleeping ok without nocturnal  or early am exacerbation  of respiratory  c/o's or need for noct saba. Also denies any obvious fluctuation of symptoms with weather or environmental changes or other aggravating or alleviating factors except as outlined above   Current Medications, Allergies, Past Medical History, Past Surgical History, Family History, and Social History were reviewed in [employer] record.  ROS  The following are not active complaints unless bolded sore throat, dysphagia, dental problems, itching, sneezing,  nasal congestion or excess/ purulent secretions, ear ache,   fever, chills, sweats, unintended wt loss, pleuritic or exertional cp, hemoptysis,  orthopnea pnd or leg swelling, presyncope, palpitations, heartburn, abdominal pain, anorexia, nausea, vomiting, diarrhea  or change in bowel or urinary habits, change in stools or urine, dysuria,hematuria,  rash, arthralgias, visual complaints, headache, numbness weakness or ataxia or problems with walking or coordination,  change in mood/affect or memory.         Objective:   Physical Exam amb thin bf nad Wt 121 08/05/2012 >123 08/19/2012 > 130 01/25/13  HEENT mild turbinate edema.  Oropharynx  no thrush or excess pnd or cobblestoning.  No JVD or cervical adenopathy. Mild accessory muscle hypertrophy. Trachea midline, nl thryroid. Chest   CTA  Regular rate and rhythm without murmur gallop or rub or increase P2 or edema.  Abd: no hsm, nl excursion. Ext warm without cyanosis or clubbing.    pcxr 07/30/12 Mild bronchitic changes      Assessment & Plan:

## 2013-01-25 NOTE — Patient Instructions (Addendum)
Start singulair 10 mg one each evening   Change dulera to 100 Take 2 puffs first thing in am and then another 2 puffs about 12 hours later - if doing great ok to drop to 2 puffs total daily   Only use your albuterol (proaire) as a rescue medication to be used if you can't catch your breath by resting or doing a relaxed purse lip breathing pattern. The less you use it, the better it will work when you need it.  Goal is less than twice weekly,  Could use it up 2puffs every 4 hours but call immediately for visit and ask for Tammy or me.   Adds mandatory f/u ov in 6 weeks

## 2013-01-27 ENCOUNTER — Telehealth: Payer: Self-pay | Admitting: *Deleted

## 2013-01-27 NOTE — Assessment & Plan Note (Addendum)
I had an extended discussion with the patient today lasting 15 to 20 minutes of a 25 minute visit on the following issues:  Warned re rule of 2's and the greatest risk of status asthmaticus is a hx of status which she has  The proper method of use, as well as anticipated side effects, of a metered-dose inhaler are discussed and demonstrated to the patient. Improved effectiveness after extensive coaching during this visit to a level of approximately  90%    Each maintenance medication was reviewed in detail including most importantly the difference between maintenance and as needed and under what circumstances the prns are to be used.  Please see instructions for details which were reviewed in writing and the patient given a copy.    Will add back dulera and singulair then regroup in 6 weeks

## 2013-01-27 NOTE — Telephone Encounter (Signed)
Spoke with pt and notified of recs per MW She verbalized understanding and denied any questions I have scheduled rov for 03/10/13 at 11:30 am

## 2013-01-27 NOTE — Telephone Encounter (Signed)
Message copied by Christen Butter on Fri Jan 27, 2013  1:39 PM ------      Message from: Sandrea Hughs B      Created: Fri Jan 27, 2013  8:27 AM       Needs f/u ov with meds in hand in 6 weeks, me or Tammy (forgot to  Tell her this on instruction sheet, but she really needs to check back with [place] to be sure her asthma is where is needs to be based on previous hx of lifethreatening asthma ------

## 2013-03-10 ENCOUNTER — Ambulatory Visit: Payer: BC Managed Care – PPO | Admitting: Internal Medicine

## 2013-03-20 ENCOUNTER — Ambulatory Visit: Payer: BC Managed Care – PPO | Admitting: Internal Medicine

## 2013-04-05 ENCOUNTER — Ambulatory Visit: Payer: BC Managed Care – PPO | Admitting: Internal Medicine

## 2013-04-26 ENCOUNTER — Telehealth: Payer: Self-pay | Admitting: Internal Medicine

## 2013-04-26 MED ORDER — MOMETASONE FURO-FORMOTEROL FUM 100-5 MCG/ACT IN AERO: INHALATION_SPRAY | RESPIRATORY_TRACT | Status: DC

## 2013-04-26 NOTE — Telephone Encounter (Signed)
Spoke with patient, patient aware samples have been placed upfront for her pickup Patient has also scheduled F/U with Dr. Sherene Sires for 05/05/13 Nothing further needed at this time

## 2013-05-05 ENCOUNTER — Ambulatory Visit: Payer: BC Managed Care – PPO | Admitting: Internal Medicine

## 2013-05-11 ENCOUNTER — Ambulatory Visit: Payer: BC Managed Care – PPO | Admitting: Internal Medicine

## 2013-05-17 ENCOUNTER — Ambulatory Visit: Payer: BC Managed Care – PPO | Admitting: Internal Medicine

## 2013-08-24 ENCOUNTER — Emergency Department (HOSPITAL_COMMUNITY)
Admission: EM | Admit: 2013-08-24 | Discharge: 2013-08-24 | Disposition: A | Discharge status: Home / Self Care | Payer: Self-pay | Source: Home / Self Care

## 2013-08-24 ENCOUNTER — Other Ambulatory Visit (HOSPITAL_COMMUNITY)
Admission: RE | Admit: 2013-08-24 | Discharge: 2013-08-24 | Disposition: A | Discharge status: Home / Self Care | Payer: BC Managed Care – PPO | Source: Ambulatory Visit | Attending: Family Medicine | Admitting: Family Medicine

## 2013-08-24 ENCOUNTER — Encounter (HOSPITAL_COMMUNITY): Payer: Self-pay | Admitting: Emergency Medicine

## 2013-08-24 DIAGNOSIS — N760 Acute vaginitis: Secondary | ICD-10-CM

## 2013-08-24 DIAGNOSIS — A499 Bacterial infection, unspecified: Secondary | ICD-10-CM

## 2013-08-24 DIAGNOSIS — N898 Other specified noninflammatory disorders of vagina: Secondary | ICD-10-CM

## 2013-08-24 DIAGNOSIS — Z113 Encounter for screening for infections with a predominantly sexual mode of transmission: Secondary | ICD-10-CM | POA: Insufficient documentation

## 2013-08-24 DIAGNOSIS — B9689 Other specified bacterial agents as the cause of diseases classified elsewhere: Secondary | ICD-10-CM

## 2013-08-24 LAB — POCT URINALYSIS DIP (DEVICE)
Bilirubin Urine: NEGATIVE
Glucose, UA: NEGATIVE mg/dL
Ketones, ur: NEGATIVE mg/dL
Specific Gravity, Urine: 1.02 (ref 1.005–1.030)

## 2013-08-24 LAB — POCT PREGNANCY, URINE: Preg Test, Ur: NEGATIVE

## 2013-08-24 MED ORDER — FLUCONAZOLE 150 MG PO TABS: ORAL_TABLET | ORAL | Status: DC

## 2013-08-24 MED ORDER — METRONIDAZOLE 500 MG PO TABS: 500.0000 mg | ORAL_TABLET | Freq: Two times a day (BID) | ORAL | Status: DC

## 2013-08-24 NOTE — ED Notes (Signed)
Vaginal discharge for 2 days, no notices odor.  Reports no sex in 8 months, has a history of vaginal infections related to changing soaps, detergents.

## 2013-08-24 NOTE — ED Provider Notes (Signed)
CSN: [id number]     Arrival date & time 08/24/13  1224 History   First MD Initiated Contact with Patient 08/24/13 1358     Chief Complaint  Patient presents with  . Vaginal Discharge   (Consider location/radiation/quality/duration/timing/severity/associated sxs/prior Treatment) HPI Comments: 22 year old female complaining of a vaginal discharge for 2 days. Yesterday had a mild odor today the other is worse. She denies pelvic pain or fever. Denies urinary symptoms. States she has not had sexual intercourse made months. She is attributing to discharge to exposure to various bath salts, soaps and detergents.   Past Medical History  Diagnosis Date  . Asthma   . Chlamydia   . Urinary tract infection, recurrent    Past Surgical History  Procedure Laterality Date  . Breast lumpectomy  2000    fibroadenma   Family History  Problem Relation Age of Onset  . Ovarian cancer Mother   . Heart disease Mother   . Hypertension Mother   . Mental illness Mother   . Hypertension Father   . Hypertension Maternal Aunt   . Hypertension Maternal Uncle   . Hypertension Maternal Grandmother   . Kidney disease Maternal Grandmother   . COPD Maternal Grandmother   . Breast cancer Maternal Grandmother   . Hypertension Maternal Grandfather    History  Substance Use Topics  . Smoking status: Never Smoker   . Smokeless tobacco: Never Used  . Alcohol Use: Yes     Comment: occasional   OB History   Grav Para Term Preterm Abortions TAB SAB Ect Mult Living   1 1 1       1      Review of Systems  Constitutional: Negative.   Respiratory: Negative.   Genitourinary: Positive for vaginal discharge. Negative for dysuria, frequency, vaginal bleeding, difficulty urinating, menstrual problem and pelvic pain.  Skin: Negative.     Allergies  Review of patient's allergies indicates no known allergies.  Home Medications   Current Outpatient Rx  Name  Route  Sig  Dispense  Refill  . mometasone-formoterol  (DULERA) 100-5 MCG/ACT AERO      Take 2 puffs first thing in am and then another 2 puffs about 12 hours later.   2 Inhaler   0   . albuterol (PROAIR HFA) 108 (90 BASE) MCG/ACT inhaler   Inhalation   Inhale 2 puffs into the lungs every 6 (six) hours as needed for wheezing. 2 puffs every 4 hours as needed only  if your can't catch your breath   1 Inhaler   1   . fluconazole (DIFLUCAN) 150 MG tablet      1 tab po x 1. May repeat in 72 hours if no improvement   2 tablet   0   . guaiFENesin (MUCINEX) 600 MG 12 hr tablet   Oral   Take 600 mg by mouth 2 (two) times daily as needed.         . metroNIDAZOLE (FLAGYL) 500 MG tablet   Oral   Take 1 tablet (500 mg total) by mouth 2 (two) times daily. X 7 days   14 tablet   0   . montelukast (SINGULAIR) 10 MG tablet   Oral   Take 1 tablet (10 mg total) by mouth at bedtime.   30 tablet   11    BP 105/63  Pulse 76  Temp(Src) 98.1 F (36.7 C) (Oral)  Resp 18  SpO2 100%  LMP 08/14/2013 Physical Exam  Nursing note and vitals reviewed.  Constitutional: She is oriented to person, place, and time. She appears well-developed and well-nourished. No distress.  Eyes: EOM are normal.  Neck: Normal range of motion. Neck supple.  Cardiovascular: Normal rate.   Pulmonary/Chest: Effort normal. No respiratory distress.  Abdominal: Soft. There is no tenderness.  Genitourinary:  Normal external female genitalia There is scant amount of pale green discharge. Cervix is posterior and right of midline. Os is parous There is a clear mucoid fluid in the os. No cervical erythema, lesions or discoloration. Minor CMT.  Musculoskeletal: She exhibits no edema.  Neurological: She is alert and oriented to person, place, and time.  Skin: Skin is warm and dry.  Psychiatric: She has a normal mood and affect.    ED Course  Procedures (including critical care time) Labs Review Labs Reviewed  POCT URINALYSIS DIP (DEVICE)  POCT PREGNANCY, URINE   CERVICOVAGINAL ANCILLARY ONLY   Imaging Review No results found.    MDM   1. Vaginal discharge   2. BV (bacterial vaginosis)      Flagyl 500 bid Diflucan 1 prn yeast inf. cyto swabs collected  Hayden Rasmussen, NP 08/24/13 1446

## 2013-08-25 NOTE — ED Notes (Signed)
GC/Chlamydia neg., Affirm: Candida and Trich neg., Gardnerella pos.  Pt. adequately treated with Flagyl. Soren Pigman M 08/25/2013  

## 2013-08-26 NOTE — ED Provider Notes (Signed)
Medical screening examination/treatment/procedure(s) were performed by a resident physician or non-physician practitioner and as the supervising physician I was immediately available for consultation/collaboration.  Clementeen Graham, MD    Rodolph Bong, MD 08/26/13 [phone]

## 2013-11-10 ENCOUNTER — Encounter (HOSPITAL_COMMUNITY): Payer: Self-pay | Admitting: Emergency Medicine

## 2013-11-10 ENCOUNTER — Emergency Department (HOSPITAL_COMMUNITY)
Admission: EM | Admit: 2013-11-10 | Discharge: 2013-11-10 | Disposition: A | Discharge status: Home / Self Care | Payer: BC Managed Care – PPO | Attending: Emergency Medicine | Admitting: Emergency Medicine

## 2013-11-10 DIAGNOSIS — Y92410 Unspecified street and highway as the place of occurrence of the external cause: Secondary | ICD-10-CM | POA: Insufficient documentation

## 2013-11-10 DIAGNOSIS — IMO0002 Reserved for concepts with insufficient information to code with codable children: Secondary | ICD-10-CM | POA: Insufficient documentation

## 2013-11-10 DIAGNOSIS — S4980XA Other specified injuries of shoulder and upper arm, unspecified arm, initial encounter: Secondary | ICD-10-CM | POA: Insufficient documentation

## 2013-11-10 DIAGNOSIS — S0990XA Unspecified injury of head, initial encounter: Secondary | ICD-10-CM | POA: Insufficient documentation

## 2013-11-10 DIAGNOSIS — Z87440 Personal history of urinary (tract) infections: Secondary | ICD-10-CM | POA: Insufficient documentation

## 2013-11-10 DIAGNOSIS — J45909 Unspecified asthma, uncomplicated: Secondary | ICD-10-CM | POA: Insufficient documentation

## 2013-11-10 DIAGNOSIS — Y9389 Activity, other specified: Secondary | ICD-10-CM | POA: Insufficient documentation

## 2013-11-10 DIAGNOSIS — Z8619 Personal history of other infectious and parasitic diseases: Secondary | ICD-10-CM | POA: Insufficient documentation

## 2013-11-10 DIAGNOSIS — T148XXA Other injury of unspecified body region, initial encounter: Secondary | ICD-10-CM

## 2013-11-10 DIAGNOSIS — Z792 Long term (current) use of antibiotics: Secondary | ICD-10-CM | POA: Insufficient documentation

## 2013-11-10 DIAGNOSIS — Z79899 Other long term (current) drug therapy: Secondary | ICD-10-CM | POA: Insufficient documentation

## 2013-11-10 DIAGNOSIS — S46909A Unspecified injury of unspecified muscle, fascia and tendon at shoulder and upper arm level, unspecified arm, initial encounter: Secondary | ICD-10-CM | POA: Insufficient documentation

## 2013-11-10 NOTE — ED Provider Notes (Signed)
Medical screening examination/treatment/procedure(s) were performed by non-physician practitioner and as supervising physician I was immediately available for consultation/collaboration.  EKG Interpretation   None         Layla MawKristen N Ward, DO 11/10/13 1454
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
## 2014-01-09 ENCOUNTER — Telehealth: Payer: Self-pay | Admitting: Internal Medicine

## 2014-01-09 NOTE — Telephone Encounter (Signed)
Spoke with pt. Aware we do not have any samples at this time. Nothing further needed

## 2014-01-17 ENCOUNTER — Ambulatory Visit: Payer: BC Managed Care – PPO | Admitting: Internal Medicine

## 2014-06-06 ENCOUNTER — Ambulatory Visit (INDEPENDENT_AMBULATORY_CARE_PROVIDER_SITE_OTHER): Payer: BC Managed Care – PPO | Admitting: Internal Medicine

## 2014-06-06 ENCOUNTER — Encounter: Payer: Self-pay | Admitting: Internal Medicine

## 2014-06-06 VITALS — BP 110/64 | HR 84 | Temp 98.6°F | Ht 65.0 in | Wt 142.8 lb

## 2014-06-06 DIAGNOSIS — J4540 Moderate persistent asthma, uncomplicated: Secondary | ICD-10-CM

## 2014-06-06 DIAGNOSIS — J45909 Unspecified asthma, uncomplicated: Secondary | ICD-10-CM

## 2014-06-06 MED ORDER — ALBUTEROL SULFATE HFA 108 (90 BASE) MCG/ACT IN AERS: INHALATION_SPRAY | RESPIRATORY_TRACT | Status: DC

## 2014-06-06 MED ORDER — BUDESONIDE-FORMOTEROL FUMARATE 80-4.5 MCG/ACT IN AERO: INHALATION_SPRAY | RESPIRATORY_TRACT | Status: DC

## 2014-06-06 MED ORDER — PREDNISONE 10 MG PO TABS: ORAL_TABLET | ORAL | Status: DC

## 2014-06-06 NOTE — Assessment & Plan Note (Signed)
DDX of  difficult airways management all start with A and  include Adherence, Ace Inhibitors, Acid Reflux, Active Sinus Disease, Alpha 1 Antitripsin deficiency, Anxiety masquerading as Airways dz,  ABPA,  allergy(esp in young), Aspiration (esp in elderly), Adverse effects of DPI,  Active smokers, plus two Bs  = Bronchiectasis and Beta blocker use..and one C= CHF  Adherence is always the initial "prime suspect" and is a multilayered concern that requires a "trust but verify" approach in every patient - starting with knowing how to use medications, especially inhalers, correctly, keeping up with refills and understanding the fundamental difference between maintenance and prns vs those medications only taken for a very short course and then stopped and not refilled.  The proper method of use, as well as anticipated side effects, of a metered-dose inhaler are discussed and demonstrated to the patient. Improved effectiveness after extensive coaching during this visit to a level of approximately  90% but can't afford dulera thru [facility] plan so try the 25 dollar [place] plan > symbicort 80 2bid and return with formulary if can't afford this  See instructions for specific recommendations which were reviewed directly with the patient who was given a copy with highlighter outlining the key components.

## 2014-06-06 NOTE — Patient Instructions (Signed)
Change to symbicort 80 Take 2 puffs first thing in am and then another 2 puffs about 12 hours later.   Only use your albuterol (proair) as a rescue medication to be used if you can't catch your breath by resting or doing a relaxed purse lip breathing pattern.  - The less you use it, the better it will work when you need it. - Ok to use up to 2 puffs  every 4 hours if you must but call for immediate appointment if use goes up over your usual need - Don't leave home without it !!  (think of it like the spare tire for your car)   If not better > Prednisone 10 mg take  4 each am x 2 days,   2 each am x 2 days,  1 each am x 2 days and stop   Please schedule a follow up visit in 3 months but call sooner if needed

## 2014-06-06 NOTE — Progress Notes (Signed)
Subjective:    Patient ID: Terri Howard, female    DOB: [birth year]  MRN: [id number]     Brief patient profile:  23 yobf never smoker "asthma all her life" always difficult to control and 2 previous hosp in [place] eval by allergist in [place] "allergic to everything" on shots until 2008 couldn't tell they helped much but moved to [facility] in 2008 stopped shots (still  requiring er and hosp while on shots so not clear then helped) referred to pulmonary clinic by Dr Brien Few p admit [facility]:  Admit date: 07/30/2012  Discharge date: 08/02/2012    Discharge Diagnoses:  Principal Problem:  *Status asthmaticus  Active Problems:  SOB (shortness of breath)  Hypoxemia  Acute respiratory distress  Acute bronchitis  Hyperglycemia   History of Present Illness  08/04/2012 1st pulmonary eval/ Edi Gorniak cc breathing some better but  still on prednisone taper cc  daily symptoms of sob with > slow adls using saba twice daily hfa albertol and used neb 1 am and ventolin around 8 am on day of 1st ov at 10:30am with prominent mostly dry coughing assoc with chest tightness and subjective wheeze as well as nasal congestion and  Sneezing.  Does not recognize specific triggers but some worse with season changes >>stopped Advair , begin Dulera  08/19/2012 Follow up  Returns for 2 weeks follow up for asthma Started on Dulera last ov. Advair discontinued.  Reports breathing is much improved with the dulera, no longer wheezing.   finished the pred taper 6days ago and did not pick up the prilosec or pepcid. No SABA in 2 weeks.  Best she has ever been.     01/25/2013 f/u ov/Theron Cumbie for asthma Chief Complaint  Patient presents with  . Follow-up    Pt states her breathing is much improved. She states feels dulera is really helping, alot.    out of dulera x 2 weeks and beginning to feel a tight in evening.  Has no saba either hfa or neb. No sob, no cough. Does have some sinus congestion and eyes watering better  with singulair in past but tends to run out of meds when feels better and does not refill them rec Start singulair 10 mg one each evening  Change dulera to 100 Take 2 puffs first thing in am and then another 2 puffs about 12 hours later - if doing great ok to drop to 2 puffs total daily  Only use your albuterol (proaire)    06/06/2014 f/u ov/Nakota Ackert re: asthma flare off meds  Chief Complaint  Patient presents with  . Acute Visit    Pt c/o wheezing and increased SOB x 6 days. She also c/o non prod cough and nasal congestion. She does not currently have a rescue inhaler and ran out of dulera several months ago.  worse cough/ wheezing at hs since off dulera, thinks it helped more than singulair in retrospect, much worse x 6 days s purulent sputum.     No obvious daytime variabilty or assoc  cp or    overt   or hb symptoms. No unusual exp hx or h/o childhood pna/  or premature birth to her knowledge.   Sleeping ok without nocturnal  or early am exacerbation  of respiratory  c/o's or need for noct saba. Also denies any obvious fluctuation of symptoms with weather or environmental changes or other aggravating or alleviating factors except as outlined above   Current Medications, Allergies, Past Medical History, Past Surgical History, Family  History, and Social History were reviewed in [employer] record.  ROS  The following are not active complaints unless bolded sore throat, dysphagia, dental problems, itching, sneezing,  nasal congestion or excess/ purulent secretions, ear ache,   fever, chills, sweats, unintended wt loss, pleuritic or exertional cp, hemoptysis,  orthopnea pnd or leg swelling, presyncope, palpitations, heartburn, abdominal pain, anorexia, nausea, vomiting, diarrhea  or change in bowel or urinary habits, change in stools or urine, dysuria,hematuria,  rash, arthralgias, visual complaints, headache, numbness weakness or ataxia or problems with walking or  coordination,  change in mood/affect or memory.         Objective:   Physical Exam amb thin bf nad Wt 121 08/05/2012 >123 08/19/2012 > 130 01/25/13 > 06/06/2014 143  HEENT mild turbinate edema.  Oropharynx no thrush or excess pnd or cobblestoning.  No JVD or cervical adenopathy. Mild accessory muscle hypertrophy. Trachea midline, nl thryroid. Chest  Distant mid exp wheeze bilaterally  CTA  Regular rate and rhythm without murmur gallop or rub or increase P2 or edema.  Abd: no hsm, nl excursion. Ext warm without cyanosis or clubbing.    pcxr 07/30/12 Mild bronchitic changes      Assessment & Plan:

## 2014-06-12 ENCOUNTER — Telehealth: Payer: Self-pay | Admitting: Internal Medicine

## 2014-06-12 NOTE — Telephone Encounter (Signed)
LMTCB x 1 

## 2014-06-13 NOTE — Telephone Encounter (Signed)
lmtcb x2 

## 2014-06-14 NOTE — Telephone Encounter (Signed)
Patient returning call.

## 2014-06-14 NOTE — Telephone Encounter (Signed)
Pt needing a work note stating that he was seen 06/06/14 in our office.  faxed to # [phone]  This has been completed.  Pt aware.  Nothing further needed.

## 2014-06-14 NOTE — Telephone Encounter (Signed)
lmtcb x3 

## 2014-08-06 ENCOUNTER — Encounter: Payer: Self-pay | Admitting: Internal Medicine

## 2014-11-05 ENCOUNTER — Encounter (HOSPITAL_COMMUNITY): Payer: Self-pay

## 2014-11-05 ENCOUNTER — Inpatient Hospital Stay (HOSPITAL_COMMUNITY)
Admission: EM | Admit: 2014-11-05 | Discharge: 2014-11-07 | DRG: 918 | Disposition: A | Discharge status: Other Acute Inpatient Hospital | Payer: Managed Care, Other (non HMO) | Attending: Family Medicine | Admitting: Family Medicine

## 2014-11-05 DIAGNOSIS — F329 Major depressive disorder, single episode, unspecified: Secondary | ICD-10-CM | POA: Diagnosis present

## 2014-11-05 DIAGNOSIS — T43022A Poisoning by tetracyclic antidepressants, intentional self-harm, initial encounter: Principal | ICD-10-CM | POA: Diagnosis present

## 2014-11-05 DIAGNOSIS — F419 Anxiety disorder, unspecified: Secondary | ICD-10-CM | POA: Diagnosis present

## 2014-11-05 DIAGNOSIS — E876 Hypokalemia: Secondary | ICD-10-CM | POA: Diagnosis present

## 2014-11-05 DIAGNOSIS — T50901A Poisoning by unspecified drugs, medicaments and biological substances, accidental (unintentional), initial encounter: Secondary | ICD-10-CM | POA: Diagnosis present

## 2014-11-05 DIAGNOSIS — R001 Bradycardia, unspecified: Secondary | ICD-10-CM | POA: Diagnosis present

## 2014-11-05 DIAGNOSIS — J45909 Unspecified asthma, uncomplicated: Secondary | ICD-10-CM | POA: Diagnosis present

## 2014-11-05 DIAGNOSIS — T50902A Poisoning by unspecified drugs, medicaments and biological substances, intentional self-harm, initial encounter: Secondary | ICD-10-CM

## 2014-11-05 DIAGNOSIS — T43014A Poisoning by tricyclic antidepressants, undetermined, initial encounter: Secondary | ICD-10-CM

## 2014-11-05 DIAGNOSIS — Y929 Unspecified place or not applicable: Secondary | ICD-10-CM | POA: Diagnosis not present

## 2014-11-05 DIAGNOSIS — I959 Hypotension, unspecified: Secondary | ICD-10-CM

## 2014-11-05 DIAGNOSIS — J4540 Moderate persistent asthma, uncomplicated: Secondary | ICD-10-CM | POA: Diagnosis present

## 2014-11-05 HISTORY — DX: Carrier of group B Streptococcus: Z22.330

## 2014-11-05 LAB — COMPREHENSIVE METABOLIC PANEL
ALK PHOS: 80 U/L (ref 39–117)
ALT: 15 U/L (ref 0–35)
ANION GAP: 7 (ref 5–15)
AST: 20 U/L (ref 0–37)
Albumin: 4 g/dL (ref 3.5–5.2)
BUN: 9 mg/dL (ref 6–23)
CALCIUM: 8.9 mg/dL (ref 8.4–10.5)
CO2: 24 mmol/L (ref 19–32)
CREATININE: 0.85 mg/dL (ref 0.50–1.10)
Chloride: 110 mmol/L (ref 96–112)
GFR calc non Af Amer: >90 mL/min (ref >90)
Glucose, Bld: 107 mg/dL — ABNORMAL HIGH (ref 70–99)
Potassium: 3.4 mmol/L — ABNORMAL LOW (ref 3.5–5.1)
SODIUM: 141 mmol/L (ref 135–145)
Total Bilirubin: 0.5 mg/dL (ref 0.3–1.2)
Total Protein: 6.9 g/dL (ref 6.0–8.3)

## 2014-11-05 LAB — SALICYLATE LEVEL: Salicylate Lvl: <4 mg/dL (ref 2.8–20.0)

## 2014-11-05 LAB — MAGNESIUM: Magnesium: 2.2 mg/dL (ref 1.5–2.5)

## 2014-11-05 LAB — RAPID URINE DRUG SCREEN, HOSP PERFORMED
AMPHETAMINES: NOT DETECTED
BARBITURATES: NOT DETECTED
Benzodiazepines: NOT DETECTED
Cocaine: NOT DETECTED
Opiates: NOT DETECTED
Tetrahydrocannabinol: NOT DETECTED

## 2014-11-05 LAB — CBC
HEMATOCRIT: 44.6 % (ref 36.0–46.0)
HEMOGLOBIN: 14.5 g/dL (ref 12.0–15.0)
MCH: 30.1 pg (ref 26.0–34.0)
MCHC: 32.5 g/dL (ref 30.0–36.0)
MCV: 92.7 fL (ref 78.0–100.0)
Platelets: 190 10*3/uL (ref 150–400)
RBC: 4.81 MIL/uL (ref 3.87–5.11)
RDW: 13.2 % (ref 11.5–15.5)
WBC: 5.5 10*3/uL (ref 4.0–10.5)

## 2014-11-05 LAB — POC URINE PREG, ED: Preg Test, Ur: NEGATIVE

## 2014-11-05 LAB — ACETAMINOPHEN LEVEL: Acetaminophen (Tylenol), Serum: <10 ug/mL — ABNORMAL LOW (ref 10–30)

## 2014-11-05 LAB — ETHANOL: ALCOHOL ETHYL (B): 60 mg/dL — AB (ref 0–9)

## 2014-11-05 LAB — HM PAP SMEAR

## 2014-11-05 MED ORDER — SODIUM BICARBONATE 8.4 % IV SOLN
50.0000 meq | Freq: Once | INTRAVENOUS | Status: AC
  Administered 2014-11-05: 50 meq via INTRAVENOUS
  Filled 2014-11-05: qty 50

## 2014-11-05 MED ORDER — ALBUTEROL SULFATE HFA 108 (90 BASE) MCG/ACT IN AERS: 1.0000 | INHALATION_SPRAY | Freq: Four times a day (QID) | RESPIRATORY_TRACT | Status: DC | PRN

## 2014-11-05 MED ORDER — STERILE WATER FOR INJECTION IV SOLN
INTRAVENOUS | Status: DC
  Administered 2014-11-05 – 2014-11-06 (×3): via INTRAVENOUS
  Filled 2014-11-05 (×12): qty 850

## 2014-11-05 MED ORDER — SODIUM CHLORIDE 0.9 % IV BOLUS (SEPSIS)
1000.0000 mL | Freq: Once | INTRAVENOUS | Status: AC
  Administered 2014-11-05: 1000 mL via INTRAVENOUS

## 2014-11-05 MED ORDER — BUDESONIDE-FORMOTEROL FUMARATE 80-4.5 MCG/ACT IN AERO: 2.0000 | INHALATION_SPRAY | Freq: Two times a day (BID) | RESPIRATORY_TRACT | Status: DC | PRN

## 2014-11-05 NOTE — ED Provider Notes (Signed)
TIME SEEN: 8:35 PM  CHIEF COMPLAINT: Overdose, suicidal ideation  HPI: Patient is a 24 y.o. F with history of asthma who presents emergency department with a suicide attempt. Reports that she was fired from her job today so between 6:30 and 7 PM she took 9-10 tablets of Lamictal, by tablets of BuSpar and an unknown amount of doxepin with half a bottle of wine. Denies any other coingestants but she is on Abilify. Denies any illicit drug use. Denies any current medical complaints. States she was trying to harm herself. No HI or hallucinations. Denies prior suicide attempt.  ROS: See HPI Constitutional: no fever  Eyes: no drainage  ENT: no runny nose   Cardiovascular:  no chest pain  Resp: no SOB  GI: no vomiting GU: no dysuria Integumentary: no rash  Allergy: no hives  Musculoskeletal: no leg swelling  Neurological: no slurred speech ROS otherwise negative  PAST MEDICAL HISTORY/PAST SURGICAL HISTORY:  Past Medical History  Diagnosis Date  . Asthma   . Chlamydia   . Urinary tract infection, recurrent     MEDICATIONS:  Prior to Admission medications   Medication Sig Start Date End Date Taking? Authorizing Provider  albuterol (PROAIR HFA) 108 (90 BASE) MCG/ACT inhaler 2 puffs every 4 hours as needed only  if your can't catch your breath 06/06/14   Nyoka Cowden, MD  ARIPiprazole (ABILIFY) 10 MG tablet Take 10 mg by mouth daily.    Historical Provider, MD  budesonide-formoterol (SYMBICORT) 80-4.5 MCG/ACT inhaler Take 2 puffs first thing in am and then another 2 puffs about 12 hours later. 06/06/14   Nyoka Cowden, MD  BusPIRone HCl (BUSPAR PO) Take 2 tablets by mouth at bedtime.    Historical Provider, MD  LamoTRIgine (LAMICTAL PO) Take 1 tablet by mouth daily.    Historical Provider, MD  predniSONE (DELTASONE) 10 MG tablet Take  4 each am x 2 days,   2 each am x 2 days,  1 each am x 2 days and stop 06/06/14   Nyoka Cowden, MD    ALLERGIES:  No Known Allergies  SOCIAL HISTORY:   History  Substance Use Topics  . Smoking status: Never Smoker   . Smokeless tobacco: Never Used  . Alcohol Use: Yes     Comment: occasional    FAMILY HISTORY: Family History  Problem Relation Age of Onset  . Ovarian cancer Mother   . Heart disease Mother   . Hypertension Mother   . Mental illness Mother   . Hypertension Father   . Hypertension Maternal Aunt   . Hypertension Maternal Uncle   . Hypertension Maternal Grandmother   . Kidney disease Maternal Grandmother   . COPD Maternal Grandmother   . Breast cancer Maternal Grandmother   . Hypertension Maternal Grandfather     EXAM: BP 114/57 mmHg  Pulse 58  Temp(Src) 98.3 F (36.8 C) (Oral)  Resp 16  SpO2 99%  LMP 04/04/2014 CONSTITUTIONAL: Alert and oriented and responds appropriately to questions. Well-appearing; well-nourished, appears mildly drowsy but is arousable and protecting her airway, GCS 15 HEAD: Normocephalic EYES: Conjunctivae clear, PERRL ENT: normal nose; no rhinorrhea; moist mucous membranes; pharynx without lesions noted NECK: Supple, no meningismus, no LAD  CARD: RRR; S1 and S2 appreciated; no murmurs, no clicks, no rubs, no gallops RESP: Normal chest excursion without splinting or tachypnea; breath sounds clear and equal bilaterally; no wheezes, no rhonchi, no rales,  ABD/GI: Normal bowel sounds; non-distended; soft, non-tender, no rebound, no guarding  BACK:  The back appears normal and is non-tender to palpation, there is no CVA tenderness EXT: Normal ROM in all joints; non-tender to palpation; no edema; normal capillary refill; no cyanosis    SKIN: Normal color for age and race; warm NEURO: Moves all extremities equally PSYCH: Patient with suicidal ideation. No HI or hallucinations. Grooming and personal hygiene are appropriate.  MEDICAL DECISION MAKING: Patient here with intentional overdose. Discussed with poison control who recommends 6 hours of observation since ingestion. Patient will need to  be observed until 1 AM. Her EKG shows no interval changes but they recommend repeat EKG at the end of her observation and keeping her on a contract monitor. We'll obtain screening labs clinic Tylenol and salicylate level, urine drug screen, alcohol level. We'll give IV fluids. Consulted TTS.  ED PROGRESS: Patient has had some mild widening of her QRS but nothing ever 100 ms and a slightly prolonged QTC. We'll give 1 amp bicarbonate. She is also had mild hypotension. We'll give second liter of IV fluid bolus. Discussed with Dr. Julian Reil with hospitalist service for admission to step down.    Bicarbonate shortened patients QRS. We'll start bicarbonate drip, admit to step down.     EKG Interpretation  Date/Time:  Monday November 05 2014 20:32:16 EST Ventricular Rate:  57 PR Interval:  153 QRS Duration: 86 QT Interval:  447 QTC Calculation: 435 R Axis:   83 Text Interpretation:  Sinus rhythm Baseline wander in lead(s) I II aVR No old tracing to compare Confirmed by Mitsue Peery,  DO, Iasia Forcier [phone]) on 11/05/2014 8:39:15 PM           EKG Interpretation  Date/Time:  Monday November 05 2014 22:21:57 EST Ventricular Rate:  54 PR Interval:  149 QRS Duration: 98 QT Interval:  451 QTC Calculation: 427 R Axis:   85 Text Interpretation:  Sinus rhythm Confirmed by Arpita Fentress,  DO, Meeghan Skipper [phone]) on 11/05/2014 10:24:09 PM           EKG Interpretation  Date/Time:  Monday November 05 2014 22:58:52 EST Ventricular Rate:  47 PR Interval:  165 QRS Duration: 95 QT Interval:  493 QTC Calculation: 436 R Axis:   93 Text Interpretation:  Sinus bradycardia Borderline right axis deviation Confirmed by Edras Wilford,  DO, Atina Feeley ([id number]) on 11/05/2014 11:13:13 PM          EKG Interpretation  Date/Time:  Monday November 05 2014 23:07:41 EST Ventricular Rate:  58 PR Interval:  157 QRS Duration: 96 QT Interval:  492 QTC Calculation: 483 R Axis:   87 Text Interpretation:  Sinus arrhythmia Borderline  prolonged QT interval Confirmed by Samanthia Howland,  DO, Deisi Salonga ([id number]) on 11/05/2014 11:14:04 PM          Date: 11/05/2014 23:29  Rate: 62  Rhythm: normal sinus rhythm  QRS Axis: normal  Intervals: normal  ST/T Wave abnormalities: normal  Conduction Disutrbances: QRS is now 80 ms, QTC 4 and 72 ms  Narrative Interpretation: unremarkable           CRITICAL CARE Performed by: Raelyn Number   Total critical care time: 45 minutes  Critical care time was exclusive of separately billable procedures and treating other patients.  Critical care was necessary to treat or prevent imminent or life-threatening deterioration.  Critical care was time spent personally by me on the following activities: development of treatment plan with patient and/or surrogate as well as nursing, discussions with consultants, evaluation of patient's response to treatment, examination of patient, obtaining history  from patient or surrogate, ordering and performing treatments and interventions, ordering and review of laboratory studies, ordering and review of radiographic studies, pulse oximetry and re-evaluation of patient's condition.   Layla MawKristen N Senay Sistrunk, DO 11/05/14 [id number]

## 2014-11-05 NOTE — H&P (Signed)
[facility] History and Physical  Maddix Elford JOA:[id number] DOB: [birth year] DOA: 11/05/2014  Referring physician: EDP PCP: Pcp Not In System   Chief Complaint: OD   HPI: Terri Howard is a 24 y.o. female who found out she was being fired from work after she left today.  This made her depressed and so she decided to "halfway" kill her self.  She ingested 7 lamotrigines, 5 buspar, and an "unknown" amount of doxepin.  Her initial EKG had a QRS of 86, this worsened to 98 in the ED and was associated with SBPs in the 80s.  She was given a bolus of sodium bicarb (1 amp) and her EKG QRS improved back to 86 as did her BPs which improved to the 90s.  Review of Systems: Systems reviewed.  As above, otherwise negative  Past Medical History  Diagnosis Date  . Asthma   . Chlamydia   . Urinary tract infection, recurrent    Past Surgical History  Procedure Laterality Date  . Breast lumpectomy  2000    fibroadenma   Social History:  reports that she has never smoked. She has never used smokeless tobacco. She reports that she drinks alcohol. She reports that she does not use illicit drugs.  No Known Allergies  Family History  Problem Relation Age of Onset  . Ovarian cancer Mother   . Heart disease Mother   . Hypertension Mother   . Mental illness Mother   . Hypertension Father   . Hypertension Maternal Aunt   . Hypertension Maternal Uncle   . Hypertension Maternal Grandmother   . Kidney disease Maternal Grandmother   . COPD Maternal Grandmother   . Breast cancer Maternal Grandmother   . Hypertension Maternal Grandfather      Prior to Admission medications   Medication Sig Start Date End Date Taking? Authorizing Provider  albuterol (PROAIR HFA) 108 (90 BASE) MCG/ACT inhaler 2 puffs every 4 hours as needed only  if your can't catch your breath 06/06/14  Yes Nyoka Cowden, MD  budesonide-formoterol [facility]) 80-4.5 MCG/ACT inhaler Take 2 puffs first thing in am and then  another 2 puffs about 12 hours later. Patient taking differently: Inhale 2 puffs into the lungs 2 (two) times daily as needed (short of breath).  06/06/14  Yes Nyoka Cowden, MD  busPIRone (BUSPAR) 10 MG tablet Take 1 tablet by mouth 2 (two) times daily. 09/16/14  Yes Historical Provider, MD  doxepin (SINEQUAN) 25 MG capsule Take 25-50 mg by mouth at bedtime as needed (sleep). sleep 09/01/14  Yes Historical Provider, MD  lamoTRIgine (LAMICTAL) 25 MG tablet Take 1 tablet by mouth daily. 09/01/14  Yes Historical Provider, MD  predniSONE (DELTASONE) 10 MG tablet Take  4 each am x 2 days,   2 each am x 2 days,  1 each am x 2 days and stop Patient not taking: Reported on 11/05/2014 06/06/14   Nyoka Cowden, MD   Physical Exam: Filed Vitals:   11/05/14 2309  BP: 98/62  Pulse: 55  Temp:   Resp: 15    BP 98/62 mmHg  Pulse 55  Temp(Src) 98.3 F (36.8 C) (Oral)  Resp 15  SpO2 95%  LMP 04/04/2014  General Appearance:    Alert, oriented, no distress, appears stated age  Head:    Normocephalic, atraumatic  Eyes:    PERRL, EOMI, sclera non-icteric        Nose:   Nares without drainage or epistaxis. Mucosa, turbinates normal  Throat:  Moist mucous membranes. Oropharynx without erythema or exudate.  Neck:   Supple. No carotid bruits.  No thyromegaly.  No lymphadenopathy.   Back:     No CVA tenderness, no spinal tenderness  Lungs:     Clear to auscultation bilaterally, without wheezes, rhonchi or rales  Chest wall:    No tenderness to palpitation  Heart:    Regular rate and rhythm without murmurs, gallops, rubs  Abdomen:     Soft, non-tender, nondistended, normal bowel sounds, no organomegaly  Genitalia:    deferred  Rectal:    deferred  Extremities:   No clubbing, cyanosis or edema.  Pulses:   2+ and symmetric all extremities  Skin:   Skin color, texture, turgor normal, no rashes or lesions  Lymph nodes:   Cervical, supraclavicular, and axillary nodes normal  Neurologic:   CNII-XII intact.  Normal strength, sensation and reflexes      throughout    Labs on Admission:  Basic Metabolic Panel:  Recent Labs Lab 11/05/14 2013  NA 141  K 3.4*  CL 110  CO2 24  GLUCOSE 107*  BUN 9  CREATININE 0.85  CALCIUM 8.9   Liver Function Tests:  Recent Labs Lab 11/05/14 2013  AST 20  ALT 15  ALKPHOS 80  BILITOT 0.5  PROT 6.9  ALBUMIN 4.0   No results for input(s): LIPASE, AMYLASE in the last 168 hours. No results for input(s): AMMONIA in the last 168 hours. CBC:  Recent Labs Lab 11/05/14 2013  WBC 5.5  HGB 14.5  HCT 44.6  MCV 92.7  PLT 190   Cardiac Enzymes: No results for input(s): CKTOTAL, CKMB, CKMBINDEX, TROPONINI in the last 168 hours.  BNP (last 3 results) No results for input(s): PROBNP in the last 8760 hours. CBG: No results for input(s): GLUCAP in the last 168 hours.  Radiological Exams on Admission: No results found.  EKG: Independently reviewed.  Assessment/Plan Principal Problem:   TCA (tricyclic antidepressant) overdose of undetermined intent Active Problems:   Overdose   1. TCA OD - 1. EKG improved with initial bicarb bolus as did her SBP in the ED. 2. Will initiate bicarb gtt (3 amp in 1L of steril water) at 200 cc/hr 3. Q1H VBGs 4. Titrate bicarb gtt to pH of 7.5 to 7.55 5. Needs psych consult in AM.    Code Status: Full Code  Family Communication: No family in room Disposition Plan: Admit to SDU   Time spent: 70 min  GARDNER, JARED M. [facility] Pager [phone]  If 7AM-7PM, please contact the day team taking care of the patient [email or web address] Password [email or web address] 11/05/2014, 11:47 PM

## 2014-11-05 NOTE — BH Assessment (Signed)
Spoke with Dr. Elesa MassedWard prior to initiating assessment. Per Dr. Elesa MassedWard pt will have to be observed until around 0100 to be medically cleared. Pt took overdose of medications with wine to hurt herself after finding out she lost her job.    Assessment to commence shortly.     Clista BernhardtNancy Ashlye Oviedo, [facility] Triage Specialist 11/05/2014 9:00 PM

## 2014-11-05 NOTE — ED Notes (Signed)
Per EMS pt got fired from her job today and found out as she was getting off  Pt left went to a park and took some of her medications and drank some wine  Unknown medications she took but has bottles of lamotrigine 25mg  that is empty, buspirone Hcl 10mg , and doxepin 25mg   Pt is brought in by [employer] and EMS  Pt is agitated upon arrival  States wants to harm self but denies wanting to hurt others

## 2014-11-05 NOTE — BH Assessment (Addendum)
Tele Assessment Note   Terri Howard is an 24 y.o. female. Presenting to ED after attempted suicide by overdose. Pt reports she lost her job today, and felt her three y.o son would be better off with his father so she attempted to take her life. She denies past attempts but reports she has had suicidal thoughts many times, most recently a couple of months ago, "It was a nice day outside, and I thought today would be a good day to die." Pt reports she often wants to go to her "happy place" a white bench surrounded by trees with pink flowers, but does not want to face"God's judgement" because "I don't know where I would go." Pt denies current SI or intent, reporting she has changed her mind and would like to go home. "This is not worth it." Pt is alert and oriented times 4 with depressed and anxious mood with constricted affect. Speech is logical and coherent. Pt denies self-harm and HI. She reports use of etoh for past four years, and has a court date 11-13-14 for DWI. She reports she drinks 1-2 glasses of wine every other day. "I drink socially." Pt reports she used pain pills for a month about 5-6 years ago, denies other illicit drug use.   Pt reports she has been dealing with depression for about 4 years. She reports crying spells, and low mood. She reports not feeling like she wants to eat for the past two weeks. Pt reports she sought counseling for depression and anxiety about 3 months ago, and likes attending counseling [facility]. She reports she can not go until she pays down her no show fees. Pt reports she was prescribed medication but does not always take them, reporting she takes them when she feels bad, and then stops when she feels good. Denies sx of mania or hypomania. She reports seeing animal shadows out of the corner of her eyes for the past two weeks. She report they scare her but when she looks again there is nothing there. She denies having these sx previously.   Pt reports persistent  anxiety for the past 6 month. She reports feeling persistently anxious, racing heart beat, especially in large groups, and in work situations.She denies hx of trauma or abuse. She reports she counts things at times to calm down. Denies specific phobias, or hx of PTSD.   Pt reports she was dx with ADHD and was taking medication prior to becoming pregnant with son. She reports she is easily distracted.   Pt reports her mother was dx with bipolar.   Axis I:  296.23 Major Depressive Disorder, Severe  300.00 Unspecified Anxiety Disorder, R/O GAD Axis II: Deferred Axis III:  Past Medical History  Diagnosis Date  . Asthma   . Chlamydia   . Urinary tract infection, recurrent    Axis IV: economic problems, other psychosocial or environmental problems, problems related to legal system/crime and problems with primary support group Axis V: 31  Past Medical History:  Past Medical History  Diagnosis Date  . Asthma   . Chlamydia   . Urinary tract infection, recurrent     Past Surgical History  Procedure Laterality Date  . Breast lumpectomy  2000    fibroadenma    Family History:  Family History  Problem Relation Age of Onset  . Ovarian cancer Mother   . Heart disease Mother   . Hypertension Mother   . Mental illness Mother   . Hypertension Father   .  Hypertension Maternal Aunt   . Hypertension Maternal Uncle   . Hypertension Maternal Grandmother   . Kidney disease Maternal Grandmother   . COPD Maternal Grandmother   . Breast cancer Maternal Grandmother   . Hypertension Maternal Grandfather     Social History:  reports that she has never smoked. She has never used smokeless tobacco. She reports that she drinks alcohol. She reports that she does not use illicit drugs.  Additional Social History:  Alcohol / Drug Use Pain Medications: denies Prescriptions: SEE PTA Over the Counter: denies History of alcohol / drug use?: Yes Longest period of sobriety (when/how long): pt reports  she does not know regarding etoh, denies abuse  Negative Consequences of Use: Legal (up coming court date for DWI) Withdrawal Symptoms:  (none reported at this time, denies hx of seizures) Substance #1 Name of Substance 1: etoh 1 - Age of First Use: 19 1 - Amount (size/oz): 1-2 glasses of wine 1 - Frequency: every other day 1 - Duration: about 4 years at this level  1 - Last Use / Amount: today, half a bottle of wine Substance #2 Name of Substance 2: oxycodone 2 - Age of First Use: reports used 5-6 years ago for about a month   CIWA: CIWA-Ar BP: 114/57 mmHg Pulse Rate: (!) 58 COWS:    PATIENT STRENGTHS: (choose at least two) Communication skills  Sought OP treatment   Allergies: No Known Allergies  Home Medications:  (Not in a hospital admission)  OB/GYN Status:  Patient's last menstrual period was 04/04/2014.  General Assessment Data Location of Assessment: [facility] ED Is this a Tele or Face-to-Face Assessment?: Face-to-Face Is this an Initial Assessment or a Re-assessment for this encounter?: Initial Assessment Living Arrangements: Children, Other relatives (3 y.o son, sister staying with her currently ) Can pt return to current living arrangement?: Yes Admission Status:  (TBD, has not been established yet) Is patient capable of signing voluntary admission?: Yes Transfer from: Home Referral Source: Self/Family/Friend     [facility] Living Arrangements: Children, Other relatives (3 y.o son, sister staying with her currently ) Name of Psychiatrist: Dr. Aggie Cosierrystal  (off of [address] last name not known) Name of Therapist: [facility] Walker KehrJane Lawkins  Education Status Is patient currently in school?: No Current Grade: NA Highest grade of school patient has completed: college Name of school: NA Contact person: NA  Risk to self with the past 6 months Suicidal Ideation: Yes-Currently Present Suicidal Intent: Yes-Currently Present Is patient at risk for suicide?:  Yes Suicidal Plan?: Yes-Currently Present Specify Current Suicidal Plan: took overdose Access to Means: Yes Specify Access to Suicidal Means: medications and etoh  What has been your use of drugs/alcohol within the last 12 months?: Pt has been drinking 1-2 glasses of wine about every other day for 4 years. Has upcoming court date for DWI. Reports used pain pills for about a month 5-6 years ago, denies other use Previous Attempts/Gestures: No How many times?: 0 (thinks about it frequently ) Other Self Harm Risks: none Triggers for Past Attempts: None known Intentional Self Injurious Behavior: None Family Suicide History: Yes (mother has attempted) Recent stressful life event(s): Job Loss (lost job today ) Persecutory voices/beliefs?: No Depression: Yes Depression Symptoms: Tearfulness, Isolating, Fatigue, Feeling worthless/self pity Substance abuse history and/or treatment for substance abuse?: No Suicide prevention information given to non-admitted patients: Yes    Psychosis Hallucinations: Visual (last two weeks sees animals out of corner of eye then nothin) Delusions:  None noted  Mental Status Report Appear/Hygiene: Unremarkable, In scrubs Eye Contact: Fair Motor Activity: Unremarkable Speech: Logical/coherent Level of Consciousness: Alert Mood: Depressed, Anxious Affect: Constricted Anxiety Level: Moderate Thought Processes: Coherent, Relevant Judgement: Impaired Orientation: Person, Place, Time, Situation Obsessive Compulsive Thoughts/Behaviors: Minimal (counts)  Cognitive Functioning Concentration: Decreased Memory: Recent Intact, Remote Intact IQ: Average Insight: Fair Impulse Control: Poor Appetite: Poor Weight Loss:  (none lost less eating for two weeks) Weight Gain: 0 Sleep: No Change Total Hours of Sleep: 6 Vegetative Symptoms: None  ADLScreening [facility] Assessment Services) Patient's cognitive ability adequate to safely complete daily activities?:  Yes Patient able to express need for assistance with ADLs?: Yes Independently performs ADLs?: Yes (appropriate for developmental age)  Prior Inpatient Therapy Prior Inpatient Therapy: No Prior Therapy Dates: NA Prior Therapy Facilty/Provider(s): NA Reason for Treatment: NA  Prior Outpatient Therapy Prior Outpatient Therapy: Yes Prior Therapy Dates: current for past three months Prior Therapy Facilty/Provider(s): [facility] ([facility] and [facility] (needs to pay no show fee to [facility]) Reason for Treatment: depression and anxiety, counseling and medication mangement   ADL Screening (condition at time of admission) Patient's cognitive ability adequate to safely complete daily activities?: Yes Is the patient deaf or have difficulty hearing?: No Does the patient have difficulty concentrating, remembering, or making decisions?: Yes (hx ADHD inattentive type) Patient able to express need for assistance with ADLs?: Yes Does the patient have difficulty dressing or bathing?: No Independently performs ADLs?: Yes (appropriate for developmental age) Does the patient have difficulty walking or climbing stairs?: No Weakness of Legs: None Weakness of Arms/Hands: None  Home Assistive Devices/Equipment Home Assistive Devices/Equipment: None    Abuse/Neglect Assessment (Assessment to be complete while patient is alone) Physical Abuse: Denies Verbal Abuse: Denies Sexual Abuse: Denies Exploitation of patient/patient's resources: Denies Self-Neglect: Denies Values / Beliefs Cultural Requests During Hospitalization: None Spiritual Requests During Hospitalization: None   Advance Directives (For Healthcare) Does patient have an advance directive?: No Would patient like information on creating an advanced directive?: No - patient declined information    Additional Information 1:1 In Past 12 Months?: No CIRT Risk: No Elopement Risk: No Does patient have medical clearance?: No (to  be observed until 1 am )     Disposition:  Per Donell Sievert, PA pt meets inpt criteria once medically cleared. No current [facility] beds available. TTS to seek placement once cleared.   Informed Dr. Elesa Massed of plan to seek placement and is in agreement.  Informed Pt and RN.   Clista Bernhardt, [facility] Triage Specialist 11/05/2014 9:38 PM

## 2014-11-05 NOTE — ED Notes (Signed)
TTS states that they will begins seeking placement for the pt after 1am when she is medically cleared but she will likely be here until morning at which time she will be reevaluated by psychiatry.

## 2014-11-05 NOTE — ED Notes (Addendum)
Patient reports 7 Lamotigines/ 5 anxiety pills/  Unknown amount of doxipine at approximately 1900 along with half bottle of wine. When asked if she was trying to kill herself, patient replied, "Halfway."  Reports suicidal ideation in the past without acting on it.

## 2014-11-06 ENCOUNTER — Encounter (HOSPITAL_COMMUNITY): Payer: Self-pay | Admitting: Internal Medicine

## 2014-11-06 DIAGNOSIS — E876 Hypokalemia: Secondary | ICD-10-CM

## 2014-11-06 DIAGNOSIS — T1491 Suicide attempt: Secondary | ICD-10-CM

## 2014-11-06 DIAGNOSIS — T43012A Poisoning by tricyclic antidepressants, intentional self-harm, initial encounter: Secondary | ICD-10-CM

## 2014-11-06 DIAGNOSIS — J4540 Moderate persistent asthma, uncomplicated: Secondary | ICD-10-CM

## 2014-11-06 LAB — BLOOD GAS, VENOUS
ACID-BASE EXCESS: 5.5 mmol/L — AB (ref 0.0–2.0)
ACID-BASE EXCESS: 5.5 mmol/L — AB (ref 0.0–2.0)
ACID-BASE EXCESS: 7.6 mmol/L — AB (ref 0.0–2.0)
Acid-Base Excess: 0.7 mmol/L (ref 0.0–2.0)
Acid-Base Excess: 1.3 mmol/L (ref 0.0–2.0)
Acid-Base Excess: 1.8 mmol/L (ref 0.0–2.0)
Acid-Base Excess: 2.8 mmol/L — ABNORMAL HIGH (ref 0.0–2.0)
Acid-Base Excess: 3.6 mmol/L — ABNORMAL HIGH (ref 0.0–2.0)
Acid-Base Excess: 3.8 mmol/L — ABNORMAL HIGH (ref 0.0–2.0)
BICARBONATE: 26.2 meq/L — AB (ref 20.0–24.0)
BICARBONATE: 32.1 meq/L — AB (ref 20.0–24.0)
Bicarbonate: 25.7 mEq/L — ABNORMAL HIGH (ref 20.0–24.0)
Bicarbonate: 26.1 mEq/L — ABNORMAL HIGH (ref 20.0–24.0)
Bicarbonate: 27.3 mEq/L — ABNORMAL HIGH (ref 20.0–24.0)
Bicarbonate: 27.7 mEq/L — ABNORMAL HIGH (ref 20.0–24.0)
Bicarbonate: 27.8 mEq/L — ABNORMAL HIGH (ref 20.0–24.0)
Bicarbonate: 29.3 mEq/L — ABNORMAL HIGH (ref 20.0–24.0)
Bicarbonate: 29.4 mEq/L — ABNORMAL HIGH (ref 20.0–24.0)
DRAWN BY: 365911
DRAWN BY: 151701
DRAWN BY: 149391
Drawn by: 42075
Drawn by: 42075
Drawn by: 555291
Drawn by: 355291
FIO2: 0.21 %
FIO2: 0.21 %
FIO2: 0.21 %
FIO2: 0.21 %
FIO2: 0.21 %
FIO2: 0.21 %
FIO2: 0.21 %
FIO2: 0.21 %
FIO2: 0.21 %
O2 SAT: 61.3 %
O2 SAT: 92.7 %
O2 SAT: 93.8 %
O2 Saturation: 69.9 %
O2 Saturation: 81.7 %
O2 Saturation: 82.2 %
O2 Saturation: 84.4 %
O2 Saturation: 86.1 %
O2 Saturation: 93.6 %
PATIENT TEMPERATURE: 98.3
PATIENT TEMPERATURE: 98.3
PATIENT TEMPERATURE: 98.3
PATIENT TEMPERATURE: 98.6
PATIENT TEMPERATURE: 98.6
PATIENT TEMPERATURE: 98.7
PATIENT TEMPERATURE: 98.8
PCO2 VEN: 41.3 mmHg — AB (ref 45.0–50.0)
PCO2 VEN: 41.5 mmHg — AB (ref 45.0–50.0)
PCO2 VEN: 41.8 mmHg — AB (ref 45.0–50.0)
PCO2 VEN: 41.8 mmHg — AB (ref 45.0–50.0)
PCO2 VEN: 42 mmHg — AB (ref 45.0–50.0)
PCO2 VEN: 45.7 mmHg (ref 45.0–50.0)
PCO2 VEN: 47.2 mmHg (ref 45.0–50.0)
PH VEN: 7.408 — AB (ref 7.250–7.300)
PH VEN: 7.417 — AB (ref 7.250–7.300)
PH VEN: 7.442 — AB (ref 7.250–7.300)
PH VEN: 7.435 — AB (ref 7.250–7.300)
PH VEN: 7.466 — AB (ref 7.250–7.300)
PO2 VEN: 66.9 mmHg — AB (ref 30.0–45.0)
PO2 VEN: 43.6 mmHg (ref 30.0–45.0)
Patient temperature: 98.8
Patient temperature: 98.7
TCO2: 22.9 mmol/L (ref 0–100)
TCO2: 23.2 mmol/L (ref 0–100)
TCO2: 23.5 mmol/L (ref 0–100)
TCO2: 24.3 mmol/L (ref 0–100)
TCO2: 24.6 mmol/L (ref 0–100)
TCO2: 24.7 mmol/L (ref 0–100)
TCO2: 26 mmol/L (ref 0–100)
TCO2: 26 mmol/L (ref 0–100)
TCO2: 28.4 mmol/L (ref 0–100)
pCO2, Ven: 41.4 mmHg — ABNORMAL LOW (ref 45.0–50.0)
pCO2, Ven: 43.2 mmHg — ABNORMAL LOW (ref 45.0–50.0)
pH, Ven: 7.362 — ABNORMAL HIGH (ref 7.250–7.300)
pH, Ven: 7.412 — ABNORMAL HIGH (ref 7.250–7.300)
pH, Ven: 7.461 — ABNORMAL HIGH (ref 7.250–7.300)
pH, Ven: 7.461 — ABNORMAL HIGH (ref 7.250–7.300)
pO2, Ven: 45.3 mmHg — ABNORMAL HIGH (ref 30.0–45.0)
pO2, Ven: 49.3 mmHg — ABNORMAL HIGH (ref 30.0–45.0)
pO2, Ven: 46.7 mmHg — ABNORMAL HIGH (ref 30.0–45.0)
pO2, Ven: 62.6 mmHg — ABNORMAL HIGH (ref 30.0–45.0)
pO2, Ven: 60.2 mmHg — ABNORMAL HIGH (ref 30.0–45.0)

## 2014-11-06 LAB — BASIC METABOLIC PANEL
Anion gap: 4 — ABNORMAL LOW (ref 5–15)
BUN: 7 mg/dL (ref 6–23)
CHLORIDE: 107 mmol/L (ref 96–112)
CO2: 30 mmol/L (ref 19–32)
CREATININE: 0.75 mg/dL (ref 0.50–1.10)
Calcium: 7.6 mg/dL — ABNORMAL LOW (ref 8.4–10.5)
Glucose, Bld: 93 mg/dL (ref 70–99)
Potassium: 3.1 mmol/L — ABNORMAL LOW (ref 3.5–5.1)
Sodium: 141 mmol/L (ref 135–145)

## 2014-11-06 LAB — CBC
HEMATOCRIT: 39.4 % (ref 36.0–46.0)
HEMOGLOBIN: 12.5 g/dL (ref 12.0–15.0)
MCH: 29.3 pg (ref 26.0–34.0)
MCHC: 31.7 g/dL (ref 30.0–36.0)
MCV: 92.5 fL (ref 78.0–100.0)
Platelets: 169 10*3/uL (ref 150–400)
RBC: 4.26 MIL/uL (ref 3.87–5.11)
RDW: 13.3 % (ref 11.5–15.5)
WBC: 4.8 10*3/uL (ref 4.0–10.5)

## 2014-11-06 LAB — MRSA PCR SCREENING: MRSA by PCR: NEGATIVE

## 2014-11-06 LAB — ACETAMINOPHEN LEVEL: Acetaminophen (Tylenol), Serum: <10 ug/mL — ABNORMAL LOW (ref 10–30)

## 2014-11-06 MED ORDER — HEPARIN SODIUM (PORCINE) 5000 UNIT/ML IJ SOLN
5000.0000 [IU] | Freq: Three times a day (TID) | INTRAMUSCULAR | Status: DC
  Administered 2014-11-06 (×2): 5000 [IU] via SUBCUTANEOUS
  Filled 2014-11-06 (×8): qty 1

## 2014-11-06 MED ORDER — ALBUTEROL SULFATE (2.5 MG/3ML) 0.083% IN NEBU: 2.5000 mg | INHALATION_SOLUTION | Freq: Four times a day (QID) | RESPIRATORY_TRACT | Status: DC | PRN

## 2014-11-06 MED ORDER — POTASSIUM CHLORIDE CRYS ER 20 MEQ PO TBCR
40.0000 meq | EXTENDED_RELEASE_TABLET | Freq: Once | ORAL | Status: AC
  Administered 2014-11-06: 40 meq via ORAL
  Filled 2014-11-06: qty 2

## 2014-11-06 NOTE — Progress Notes (Signed)
Progress Note   Cloyce Paterson AVW:[id number] DOB: [birth year] DOA: 11/05/2014 PCP: Pcp Not In System   Brief Narrative:   Terri Howard is an 24 y.o. female who was admitted 11/05/14 with an intentional overdose of 7 lamotrigine, 5 BuSpar and "unknown "amount of doxepin after being fired from work.Her initial EKG had a QRS of 86, this worsened to 98 in the ED and was associated with SBPs in the 80s. She was given a bolus of sodium bicarb (1 amp) and her EKG QRS improved back to 86 as did her BPs which improved to the 90s.  Assessment/Plan:   Principal Problem:   TCA (tricyclic antidepressant) overdose of undetermined intent/Overdose  Continue to monitor on tele.    Continue bicarb drip. Venous pH 7.61 this morning.  Patient is awake and alert. Repeat EKG this a.m. Shows HR 62, QRS 90ms, QTc 452: Stable.  Wean bicarb drip and recheck blood gases and EKG at 6 PM per poison control recommendations.  Requested psychiatry consultation.  BuSpar, Sinequan, Lamictal all on hold.  Active Problems:   Hypokalemia  Replete    Asthma  Continue albuterol as needed. Continue Symbicort.    DVT Prophylaxis  Code Status: Full. Family Communication: No family at the bedside.  Declines my offer to call her mother, but states her mother knows she is here.  Gave permission for me to call her son's father, Demetris at [phone] who was the person who called 911 after he suspected she overdosed. Disposition Plan: Home when stable.   IV Access:    Peripheral IV   Procedures and diagnostic studies:   No results found.   Medical Consultants:    Psychiatry  Anti-Infectives:    None.  Subjective:   Howard Terri is without physical complaints this morning.  No nausea, vomiting, diarrhea, pain or dyspnea.  Has poor insight/judgement.  Denies she was trying to kill herself, but cannot explain why she took the medications and what type of effect she intended as a result of  taking an overdose.  Objective:    Filed Vitals:   11/06/14 0200 11/06/14 0300 11/06/14 0400 11/06/14 0500  BP:   107/69   Pulse: 58 62 60 65  Temp:   98.7 F (37.1 C)   TempSrc:   Oral   Resp: Height:      Weight:      SpO2: 97% 96% 97% 95%    Intake/Output Summary (Last 24 hours) at 11/06/14 0711 Last data filed at 11/06/14 0600  Gross per 24 hour  Intake 3013.33 ml  Output      0 ml  Net 3013.33 ml    Exam: Gen:  NAD Psych: Flat affect, depressed mood Cardiovascular:  Mildly bradycardic, No M/R/G Respiratory:  Lungs CTAB Gastrointestinal:  Abdomen soft, NT/ND, + BS Extremities:  No C/E/C   Data Reviewed:    Labs: Basic Metabolic Panel:  Recent Labs Lab 11/05/14 2013 11/05/14 2314 11/06/14 0401  NA 141  --  141  K 3.4*  --  3.1*  CL 110  --  107  CO2 24  --  30  GLUCOSE 107*  --  93  BUN 9  --  7  CREATININE 0.85  --  0.75  CALCIUM 8.9  --  7.6*  MG  --  2.2  --    GFR Estimated Creatinine Clearance: 96.7 mL/min (by C-G formula based on Cr of 0.75). Liver Function Tests:  Recent Labs  Lab 11/05/14 2013  AST 20  ALT 15  ALKPHOS 80  BILITOT 0.5  PROT 6.9  ALBUMIN 4.0   CBC:  Recent Labs Lab 11/05/14 2013 11/06/14 0401  WBC 5.5 4.8  HGB 14.5 12.5  HCT 44.6 39.4  MCV 92.7 92.5  PLT 190 169   Microbiology Recent Results (from the past 240 hour(s))  MRSA PCR Screening     Status: None   Collection Time: 11/06/14 12:42 AM  Result Value Ref Range Status   MRSA by PCR NEGATIVE NEGATIVE Final    Comment:        The GeneXpert MRSA Assay (FDA approved for NASAL specimens only), is one component of a comprehensive MRSA colonization surveillance program. It is not intended to diagnose MRSA infection nor to guide or monitor treatment for MRSA infections.      Medications:   . heparin  5,000 Units Subcutaneous 3 times per day   Continuous Infusions: .  sodium bicarbonate 150 mEq in sterile water 1000 mL infusion  200 mL/hr at 11/06/14 0533    Time spent: 35 minutes with > 50% of time discussing current diagnostic test results, clinical impression and plan of care & coordinating care with poison control.   LOS: 1 day   Carsyn Taubman  [facility] Pager [phone]. If unable to reach me by pager, please call my cell phone at [phone].  *Please refer to [email or web address], password [email or web address] to get updated schedule on who will round on this patient, as hospitalists switch teams weekly. If 7PM-7AM, please contact night-coverage at [email or web address], password [email or web address] for any overnight needs.  11/06/2014, 7:11 AM

## 2014-11-06 NOTE — Progress Notes (Signed)
CRITICAL VALUE ALERT  Critical value received:  Veinous Blood Gas: pH 7.41, CO2 41, PO2 below readable limit, SO2 61.3%.  Date of notification:  11/06/14  Time of notification: 0115  Critical value read back:Yes.    Nurse who received alert:  Ronal FearAngie Aurthur Wingerter, RN  MD notified (1st page):  Maren ReamerKaren Kirby, NP  Time of first page:  0118  MD notified (2nd page):  Time of second page:  Responding MD:  Maren ReamerKaren Kirby, NP  Time MD responded:  0130

## 2014-11-06 NOTE — Progress Notes (Signed)
CARE MANAGEMENT NOTE 11/06/2014  Patient:  Viviano SimasELSON,Kaydee   Account Number:  [id number]  Date Initiated:  11/06/2014  Documentation initiated by:  DAVIS,RHONDA  Subjective/Objective Assessment:   patient with suicidal attempt to harm herself after losing her employment     Action/Plan:   tbd by psych   Anticipated DC Date:  11/09/2014   Anticipated DC Plan:  [facility]  In-house referral  Clinical Social Worker      DC Planning Services  CM consult      [facility]   Choice offered to / List presented to:  NA   DME arranged  NA      DME agency  NA     HH arranged  NA      HH agency  NA   Status of service:  In process, will continue to follow Medicare Important Message given?   (If response is "NO", the following Medicare IM given date fields will be blank) Date Medicare IM given:   Medicare IM given by:   Date Additional Medicare IM given:   Additional Medicare IM given by:    Discharge Disposition:    Per UR Regulation:  Reviewed for med. necessity/level of care/duration of stay  If discussed at Long Length of Stay Meetings, dates discussed:    Comments:  Feb. 02 2016/Rhonda L. Earlene Plateravis, RN, BSN, CCM/Case Management [employer] [phone] No discharge needs present of time of review.

## 2014-11-06 NOTE — Progress Notes (Signed)
Clinical Social Work Department CLINICAL SOCIAL WORK PSYCHIATRY SERVICE LINE ASSESSMENT 11/06/2014  Patient:  Terri Howard  Account:  [id number]  Admit Date:  11/05/2014  Clinical Social Worker:  Sindy Messing, LCSW  Date/Time:  11/06/2014 04:00 PM Referred by:  Physician  Date referred:  11/06/2014 Reason for Referral  Psychosocial assessment   Presenting Symptoms/Problems (In the person's/family's own words):   Psych consulted due to overdose.   Abuse/Neglect/Trauma History (check all that apply)  Denies history   Abuse/Neglect/Trauma Comments:   Psychiatric History (check all that apply)  Outpatient treatment   Psychiatric medications:  Buspar 10 mg  Lamictal 25 mg   Current Mental Health Hospitalizations/Previous Mental Health History:   Patient reports she has been diagnosed with depression. Patient reports that mother has been diagnosed with bipolar disorder and does recognize some of those symptoms in herself. Patient currently has medication management and therapy on outpatient basis.   Current provider:   Dr. Darleene Cleaver and [facility] (patient reports she has a balance and has not had therapy lately because she cannot pay bills)   Place and Date:   [place], [place]   Current Medications:   Scheduled Meds:      . heparin  5,000 Units Subcutaneous 3 times per day        Continuous Infusions:      .  sodium bicarbonate 150 mEq in sterile water 1000 mL infusion 125 mL/hr at 11/06/14 0823          PRN Meds:.albuterol, budesonide-formoterol       Previous Impatient Admission/Date/Reason:   None reported   Emotional Health / Current Symptoms    Suicide/Self Harm  Suicide attempt in past (date/description)   Suicide attempt in the past:   Patient reports that she was forced to resign from work and upset about not being able to provide for her child. Patient went to the park and overdosed on medication and drank 1/2 bottle of wine.  Patient denies any previous attempts.   Other harmful behavior:   None reported   Psychotic/Dissociative Symptoms  None reported   Other Psychotic/Dissociative Symptoms:    Attention/Behavioral Symptoms  Within Normal Limits   Other Attention / Behavioral Symptoms:   Patient engaged during assessment.    Cognitive Impairment  Within Normal Limits   Other Cognitive Impairment:   Patient alert and oriented.    Mood and Adjustment  Flat    Stress, Anxiety, Trauma, Any Recent Loss/Stressor  Other - See comment   Anxiety (frequency):   N/A   Phobia (specify):   N/A   Compulsive behavior (specify):   N/A   Obsessive behavior (specify):   N/A   Other:   Patient was forced to resign from her job.   Substance Abuse/Use  Current substance use   SBIRT completed (please refer for detailed history):  Y  Self-reported substance use:   Patient reports she drinks anywhere from 1-2 glass of wine to 1/2 bottle of wine. Patient completed SBIRT but does not feel that alcohol use is a concern. Patient reports she drinks wine to "wine down after long days." Patient is not interested in SA treatment at this time.   Urinary Drug Screen Completed:  Y Alcohol level:   60    Environmental/Housing/Living Arrangement  Stable housing   Who is in the home:   Son   Emergency contact:  Astronomer   Patient's Strengths and Goals (patient's own  words):   Patient reports her family is supportive and she has a good relationship with son's father.   Clinical Social Worker's Interpretive Summary:   CSW received referral in order to complete psychosocial assessment. CSW met with patient at bedside with psych MD.    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 Patient reports she  left work and became upset about the situation. Patient went home and got her medications and a bottle of wine. Patient went to the park and called her grandfather and her band director to tell them that she loved them. Patient reports after she overdosed on medications and drank 1/2 bottle of wine she regretted her decision and called her son's father. Patient has a three year old son but he was never in danger and father had son while patient overdosed. Son's father called 52 and patient was transported to the hospital.    Patient reports a history of depression and wonders if she has bipolar disorder. Patient reports she might have been diagnosed with borderline personality disorder in the past as well and completed DBT. Patient reports that she is prescribed medication from outpatient psychiatrist but "takes breaks from her medications" at times. Patient reports she had not taken her medication for about 1 week prior to attempt. Patient was attending outpatient therapy but due to financial barrier, patient was unable to continue therapy at this time.    Patient has flat affect and regrets her decision. Patient reports she needed a break from life but "never wanted that break to be permanent."    CSW will continue to follow and will assist with inpatient placement once medically stable.   Disposition:  Recommend Psych CSW continuing to support while in hospital   [place], [place] [phone]

## 2014-11-06 NOTE — Treatment Plan (Signed)
Awaiting formal medical clearance before [facility].  Will follow-up with SW in the AM.

## 2014-11-06 NOTE — Consult Note (Signed)
[facility] Face-to-Face Psychiatry Consult   Reason for Consult:  Intentional overdose as a suicide attempt Referring Physician:  Dr. Darnelle Catalan  Patient Identification: Terri Howard MRN:  [id number] Principal Diagnosis: TCA (tricyclic antidepressant) overdose of undetermined intent Diagnosis:   Patient Active Problem List   Diagnosis Date Noted  . Hypokalemia [E87.6] 11/06/2014  . Overdose [T50.901A] 11/05/2014  . TCA (tricyclic antidepressant) overdose of undetermined intent [T43.014A] 11/05/2014  . Hyperglycemia [R73.9] 07/30/2012  . chronic asthma  [J45.40] 08/23/2011    Total Time spent with patient: 45 minutes  Subjective:   Terri Howard is a 24 y.o. female patient admitted with  Intentional overdose as a suicide attempt.  HPI:  Terri Howard is a 24 y.o. female seen and chart reviewed for psychiatric consultation and evaluation of depression with the status post suicide attempt by taking overdose on prescription medication while intoxicated with alcohol. Patient reported she lost her job which is a major stress and trigger for her increased symptoms of depression and suicidal attempt when she thought about she cannot provide for her son without a job. Reportedly patient made several phone calls to say goodbye and then overdosed her multiple medication with intent to end her life. Patient changed her mind called her son's father and requested medical health. Patient has been outpatient psychiatric patient at neuropsychiatry [place]. Patient was not able to see her therapist due to outstanding balance. Reportedly patient likes drinking vodka 1-2 shots every night but denies being alcohol abuse and dependence. Patient denied blackouts are withdrawal  seizure or  hallucinations. Patient has no known alcohol delirium tremens. Patient graduated from [employer] 2014 and working in several places since then. Patient is currently on cardiac monitoring as per the poison control recommendation.  Patient minimizes her current suicidal thoughts saying that she needed to be there for her son who is 73 years old and her son's father is coparenting along with her.   Medical history: This is a 24 years old patient who found out she was being fired from work after she left today. This made her depressed and so she decided to "halfway" kill her self. She ingested 7 lamotrigines, 5 buspar, and an "unknown" amount of doxepin.  Her initial EKG had a QRS of 86, this worsened to 98 in the ED and was associated with SBPs in the 80s. She was given a bolus of sodium bicarb (1 amp) and her EKG QRS improved back to 86 as did her BPs which improved to the 90s.  Review of Systems: Systems reviewed. As above, otherwise negative  HPI Elements:    Location:  depression. Quality:  poor. Severity:  suicide attempt. Timing:  lost job. Duration:  few days. Context:  psychosocial stresses.  Past Medical History:  Past Medical History  Diagnosis Date  . Asthma   . Chlamydia   . Urinary tract infection, recurrent   . 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 Breast lumpectomy  2000    fibroadenma   Family History:  Family History  Problem Relation Age of Onset  . Ovarian cancer Mother   . Heart disease Mother   . Hypertension Mother   . Mental illness Mother   . Hypertension Father   . Hypertension Maternal Aunt   . Hypertension Maternal Uncle   . Hypertension Maternal Grandmother   . Kidney disease Maternal Grandmother   . COPD Maternal Grandmother   . Breast cancer Maternal Grandmother   . Hypertension  Maternal Grandfather    Social History:  History  Alcohol Use  . Yes    Comment: occasional     History  Drug Use No    History   Social History  . Marital Status: Single    Spouse Name: N/A    Number of Children: N/A  . Years of Education: N/A   Social History Main Topics  . Smoking status: Never Smoker   . Smokeless tobacco: Never  Used  . Alcohol Use: Yes     Comment: occasional  . Drug Use: No  . Sexual Activity: Yes    Birth Control/ Protection: IUD     Comment: mirena   Other Topics Concern  . None   Social History Narrative   Additional Social History:    Pain Medications: denies Prescriptions: SEE PTA Over the Counter: denies History of alcohol / drug use?: Yes Longest period of sobriety (when/how long): pt reports she does not know regarding etoh, denies abuse  Negative Consequences of Use: Legal (up coming court date for DWI) Withdrawal Symptoms:  (none reported at this time, denies hx of seizures) Name of Substance 1: etoh 1 - Age of First Use: 19 1 - Amount (size/oz): 1-2 glasses of wine 1 - Frequency: every other day 1 - Duration: about 4 years at this level  1 - Last Use / Amount: today, half a bottle of wine Name of Substance 2: oxycodone 2 - Age of First Use: reports used 5-6 years ago for about a month                  Allergies:  No Known Allergies  Vitals: Blood pressure 113/82, pulse 60, temperature 98.6 F (37 C), temperature source Oral, resp. rate 14, height  (1.651 m), weight 64.864 kg (143 lb), last menstrual period 04/04/2014, SpO2 97 %.  Risk to Self: Suicidal Ideation: Yes-Currently Present Suicidal Intent: Yes-Currently Present Is patient at risk for suicide?: Yes Suicidal Plan?: Yes-Currently Present Specify Current Suicidal Plan: took overdose Access to Means: Yes Specify Access to Suicidal Means: medications and etoh  What has been your use of drugs/alcohol within the last 12 months?: Pt has been drinking 1-2 glasses of wine about every other day for 4 years. Has upcoming court date for DWI. Reports used pain pills for about a month 5-6 years ago, denies other use How many times?: 0 (thinks about it frequently ) Other Self Harm Risks: none Triggers for Past Attempts: None known Intentional Self Injurious Behavior: None Risk to Others: Homicidal Ideation:  No Thoughts of Harm to Others: No Current Homicidal Intent: No Current Homicidal Plan: No Access to Homicidal Means: No Identified Victim: none History of harm to others?: No Assessment of Violence: None Noted Violent Behavior Description: none Does patient have access to weapons?: No Criminal Charges Pending?: No Does patient have a court date: No Prior Inpatient Therapy: Prior Inpatient Therapy: No Prior Therapy Dates: NA Prior Therapy Facilty/Provider(s): NA Reason for Treatment: NA Prior Outpatient Therapy: Prior Outpatient Therapy: Yes Prior Therapy Dates: current for past three months Prior Therapy Facilty/Provider(s): [facility] and [facility] (needs to pay no show fee [facility] Reason for Treatment: depression and anxiety, counseling and medication mangement   Current Facility-Administered Medications  Medication Dose Route Frequency Provider Last Rate Last Dose  . albuterol (PROVENTIL) (2.5 MG/3ML) 0.083% nebulizer solution 2.5 mg  2.5 mg Nebulization Q6H PRN Hillary Bow, DO      .  budesonide-formoterol (SYMBICORT) 80-4.5 MCG/ACT inhaler 2 puff  2 puff Inhalation BID PRN Hillary BowJared M Gardner, DO      . heparin injection 5,000 Units  5,000 Units Subcutaneous 3 times per day Hillary BowJared M Gardner, DO   5,000 Units at 11/06/14 [phone]  . sodium bicarbonate 150 mEq in sterile water 1,000 mL infusion   Intravenous Continuous Maryruth Bunhristina P Rama, MD 125 mL/hr at 11/06/14 0823      Musculoskeletal: Strength & Muscle Tone: within normal limits Gait & Station: normal Patient leans: N/A  Psychiatric Specialty Exam: Physical Exam  ROS  Blood pressure 113/82, pulse 60, temperature 98.6 F (37 C), temperature source Oral, resp. rate 14, height 5\' 5"  (1.651 m), weight 64.864 kg (143 lb), last menstrual period 04/04/2014, SpO2 97 %.Body mass index is 23.8 kg/(m^2).  General Appearance: Guarded  Eye Contact::  Good  Speech:  Clear and Coherent  Volume:  Decreased  Mood:   Depressed and Hopeless  Affect:  Appropriate and Congruent  Thought Process:  Coherent and Goal Directed  Orientation:  Full (Time, Place, and Person)  Thought Content:  WDL  Suicidal Thoughts:  Yes.  with intent/plan  Homicidal Thoughts:  No  Memory:  Immediate;   Good Recent;   Good  Judgement:  Impaired  Insight:  Shallow  Psychomotor Activity:  Decreased  Concentration:  Good  Recall:  Good  Fund of Knowledge:Good  Language: Good  Akathisia:  NA  Handed:  Right  AIMS (if indicated):     Assets:  Communication Skills Desire for Improvement Financial Resources/Insurance Housing Intimacy Leisure Time Physical Health Resilience Social Support Talents/Skills Transportation  ADL's:  Intact  Cognition: WNL  Sleep:      Medical Decision Making: New problem, with additional work up planned, Review of Psycho-Social Stressors (1), Review or order clinical lab tests (1), Established Problem, Worsening (2), Review of Medication Regimen & Side Effects (2) and Review of New Medication or Change in Dosage (2)  Treatment Plan Summary: Daily contact with patient to assess and evaluate symptoms and progress in treatment and Medication management  Plan:  Agrees with holding psychiatric medication at this time  Recommend psychiatric Inpatient admission when medically cleared. Supportive therapy provided about ongoing stressors.   Disposition: inpatient acute psychiatric hospitalization for crisis stabilization, safety monitoring on medication management of depression.   Terri Howard,JANARDHAHA R. 11/06/2014 10:09 AM

## 2014-11-07 ENCOUNTER — Encounter (HOSPITAL_COMMUNITY): Payer: Self-pay | Admitting: Behavioral Health

## 2014-11-07 ENCOUNTER — Inpatient Hospital Stay (HOSPITAL_COMMUNITY)
Admission: AD | Admit: 2014-11-07 | Discharge: 2014-11-10 | DRG: 885 | Disposition: A | Discharge status: Home / Self Care | Payer: 59 | Source: Intra-hospital | Attending: Psychiatry | Admitting: Psychiatry

## 2014-11-07 DIAGNOSIS — F332 Major depressive disorder, recurrent severe without psychotic features: Secondary | ICD-10-CM | POA: Diagnosis present

## 2014-11-07 DIAGNOSIS — E876 Hypokalemia: Secondary | ICD-10-CM | POA: Diagnosis present

## 2014-11-07 DIAGNOSIS — F329 Major depressive disorder, single episode, unspecified: Secondary | ICD-10-CM | POA: Diagnosis present

## 2014-11-07 DIAGNOSIS — T50904S Poisoning by unspecified drugs, medicaments and biological substances, undetermined, sequela: Secondary | ICD-10-CM

## 2014-11-07 DIAGNOSIS — Z9119 Patient's noncompliance with other medical treatment and regimen: Secondary | ICD-10-CM | POA: Diagnosis present

## 2014-11-07 DIAGNOSIS — G4700 Insomnia, unspecified: Secondary | ICD-10-CM | POA: Diagnosis present

## 2014-11-07 DIAGNOSIS — F419 Anxiety disorder, unspecified: Secondary | ICD-10-CM | POA: Diagnosis present

## 2014-11-07 DIAGNOSIS — T50902A Poisoning by unspecified drugs, medicaments and biological substances, intentional self-harm, initial encounter: Secondary | ICD-10-CM

## 2014-11-07 DIAGNOSIS — F32A Depression, unspecified: Secondary | ICD-10-CM | POA: Diagnosis present

## 2014-11-07 LAB — TSH: TSH: 0.602 u[IU]/mL (ref 0.350–4.500)

## 2014-11-07 MED ORDER — POTASSIUM CHLORIDE CRYS ER 20 MEQ PO TBCR: 40.0000 meq | EXTENDED_RELEASE_TABLET | Freq: Once | ORAL | Status: DC

## 2014-11-07 MED ORDER — MAGNESIUM HYDROXIDE 400 MG/5ML PO SUSP
30.0000 mL | Freq: Every day | ORAL | Status: DC | PRN
  Administered 2014-11-09: 30 mL via ORAL
  Filled 2014-11-07: qty 30

## 2014-11-07 MED ORDER — ALUM & MAG HYDROXIDE-SIMETH 200-200-20 MG/5ML PO SUSP: 30.0000 mL | ORAL | Status: DC | PRN

## 2014-11-07 MED ORDER — ACETAMINOPHEN 325 MG PO TABS: 650.0000 mg | ORAL_TABLET | Freq: Four times a day (QID) | ORAL | Status: DC | PRN

## 2014-11-07 MED ORDER — TRAZODONE HCL 50 MG PO TABS
50.0000 mg | ORAL_TABLET | Freq: Every evening | ORAL | Status: DC | PRN
  Administered 2014-11-07 – 2014-11-09 (×3): 50 mg via ORAL
  Filled 2014-11-07: qty 7
  Filled 2014-11-07: qty 1

## 2014-11-07 NOTE — Progress Notes (Addendum)
Clinical Social Work  [id number] RN to call report [phone]. CSW informed patient of DC plans. Patient tearful but reports she understands psych MD recommendations for inpatient placement. Patient signed voluntary form which [facility]. Patient asked CSW to contact her mother and update her on plans. CSW called and spoke with mother who is aware of [facility]. CSW arranged transportation via [employer].  CSW is signing off but available if needed.  [place]olly Syncere Howard, [place] [phone]

## 2014-11-07 NOTE — Consult Note (Signed)
Psychiatry Consult follow-up note  Reason for Consult:  Intentional overdose as a suicide attempt Referring Physician:  Dr. Rockne Menghini  Patient Identification: Terri Howard MRN:  [id number] Principal Diagnosis: TCA (tricyclic antidepressant) overdose of undetermined intent Diagnosis:   Patient Active Problem List   Diagnosis Date Noted  . Hypokalemia [E87.6] 11/06/2014  . Overdose [T50.901A] 11/05/2014  . TCA (tricyclic antidepressant) overdose of undetermined intent [T43.014A] 11/05/2014  . Hyperglycemia [R73.9] 07/30/2012  . chronic asthma  [J45.40] 08/23/2011    Total Time spent with patient: 20 minutes  Subjective:   Terri Howard is a 24 y.o. female patient admitted with  Intentional overdose as a suicide attempt.  HPI:  Terri Howard is a 24 y.o. female seen and chart reviewed for psychiatric consultation and evaluation of depression with the status post suicide attempt by taking overdose on prescription medication while intoxicated with alcohol. Patient reported she lost her job which is a major stress and trigger for her increased symptoms of depression and suicidal attempt when she thought about she cannot provide for her son without a job. Reportedly patient made several phone calls to say goodbye and then overdosed her multiple medication with intent to end her life. Patient changed her mind called her son's father and requested medical health. Patient has been outpatient psychiatric patient at [facility]. Patient was not able to see her therapist due to outstanding balance. Reportedly patient likes drinking vodka 1-2 shots every night but denies being alcohol abuse and dependence. Patient denied blackouts are withdrawal  seizure or  hallucinations. Patient has no known alcohol delirium tremens. Patient graduated from [employer] 2014 and working in several places since then. Patient is currently on cardiac monitoring as per the poison control recommendation. Patient  minimizes her current suicidal thoughts saying that she needed to be there for her son who is 2 years old and her son's father is coparenting along with her.   Interval note: Patient was seen today with case manager Sindy Messing, LCSW for psychiatric consultation follow-up. Patient continue reporting sadness, depression, anxiety, tearful and feeling guilty about suicide attempt. Patient stated she preferred not to go to the behavioral health but at the same time she went to be compliant with the treatment recommendation. Patient has questions about restarting her medication and able to wear street clothes once admitted to behavioral [facility]. Patient reportedly met with her sister who was upset with her and angry with her and at the same time she is supportive to her. Patient stated that she has been missing hurts 42 years old son who has been staying with the his dad. Patient denies current active suicidal ideation. Case discussed with Dr. Wendee Beavers regarding medical clearance is required before placing and behavioral [facility] today.  Medical history: This is a 24 years old patient who found out she was being fired from work after she left today. This made her depressed and so she decided to "halfway" kill her self. She ingested 7 lamotrigines, 5 buspar, and an "unknown" amount of doxepin. Her initial EKG had a QRS of 86, this worsened to 98 in the ED and was associated with SBPs in the 80s. She was given a bolus of sodium bicarb (1 amp) and her EKG QRS improved back to 86 as did her BPs which improved to the 90s.  Past Medical History:  Past Medical History  Diagnosis Date  . Asthma   . Chlamydia   . Urinary tract infection, recurrent   . GBS carrier 08/23/2011  Past Surgical History  Procedure Laterality Date  . Breast lumpectomy  2000    fibroadenma   Family History:  Family History  Problem Relation Age of Onset  . Ovarian cancer Mother   . Heart disease Mother   . Hypertension  Mother   . Mental illness Mother   . Hypertension Father   . Hypertension Maternal Aunt   . Hypertension Maternal Uncle   . Hypertension Maternal Grandmother   . Kidney disease Maternal Grandmother   . COPD Maternal Grandmother   . Breast cancer Maternal Grandmother   . Hypertension Maternal Grandfather    Social History:  History  Alcohol Use  . Yes    Comment: occasional     History  Drug Use No    History   Social History  . Marital Status: Single    Spouse Name: N/A    Number of Children: N/A  . Years of Education: N/A   Social History Main Topics  . Smoking status: Never Smoker   . Smokeless tobacco: Never Used  . Alcohol Use: Yes     Comment: occasional  . Drug Use: No  . Sexual Activity: Yes    Birth Control/ Protection: IUD     Comment: mirena   Other Topics Concern  . None   Social History Narrative   Additional Social History:    Pain Medications: denies Prescriptions: SEE PTA Over the Counter: denies History of alcohol / drug use?: Yes Longest period of sobriety (when/how long): pt reports she does not know regarding etoh, denies abuse  Negative Consequences of Use: Legal (up coming court date for DWI) Withdrawal Symptoms:  (none reported at this time, denies hx of seizures) Name of Substance 1: etoh 1 - Age of First Use: 19 1 - Amount (size/oz): 1-2 glasses of wine 1 - Frequency: every other day 1 - Duration: about 4 years at this level  1 - Last Use / Amount: today, half a bottle of wine Name of Substance 2: oxycodone 2 - Age of First Use: reports used 5-6 years ago for about a month                  Allergies:  No Known Allergies  Vitals: Blood pressure 102/64, pulse 62, temperature 98.1 F (36.7 C), temperature source Oral, resp. rate 14, height '5\' 5"'  (1.651 m), weight 64.864 kg (143 lb), last menstrual period 04/04/2014, SpO2 98 %.  Risk to Self: Suicidal Ideation: Yes-Currently Present Suicidal Intent: Yes-Currently  Present Is patient at risk for suicide?: Yes Suicidal Plan?: Yes-Currently Present Specify Current Suicidal Plan: took overdose Access to Means: Yes Specify Access to Suicidal Means: medications and etoh  What has been your use of drugs/alcohol within the last 12 months?: Pt has been drinking 1-2 glasses of wine about every other day for 4 years. Has upcoming court date for DWI. Reports used pain pills for about a month 5-6 years ago, denies other use How many times?: 0 (thinks about it frequently ) Other Self Harm Risks: none Triggers for Past Attempts: None known Intentional Self Injurious Behavior: None Risk to Others: Homicidal Ideation: No Thoughts of Harm to Others: No Current Homicidal Intent: No Current Homicidal Plan: No Access to Homicidal Means: No Identified Victim: none History of harm to others?: No Assessment of Violence: None Noted Violent Behavior Description: none Does patient have access to weapons?: No Criminal Charges Pending?: No Does patient have a court date: No Prior Inpatient Therapy: Prior Inpatient Therapy: No  Prior Therapy Dates: NA Prior Therapy Facilty/Provider(s): NA Reason for Treatment: NA Prior Outpatient Therapy: Prior Outpatient Therapy: Yes Prior Therapy Dates: current for past three months Prior Therapy Facilty/Provider(s): [facility] ([facility]) and [facility] (needs to pay no show fee to return [facility]) Reason for Treatment: depression and anxiety, counseling and medication mangement   Current Facility-Administered Medications  Medication Dose Route Frequency Provider Last Rate Last Dose  . albuterol (PROVENTIL) (2.5 MG/3ML) 0.083% nebulizer solution 2.5 mg  2.5 mg Nebulization Q6H PRN Etta Quill, DO      . budesonide-formoterol (SYMBICORT) 80-4.5 MCG/ACT inhaler 2 puff  2 puff Inhalation BID PRN Etta Quill, DO      . heparin injection 5,000 Units  5,000 Units Subcutaneous 3 times per day Etta Quill, DO   5,000 Units at  11/06/14 [phone]  . potassium chloride SA (K-DUR,KLOR-CON) CR tablet 40 mEq  40 mEq Oral Once Velvet Bathe, MD      . sodium bicarbonate 150 mEq in sterile water 1,000 mL infusion   Intravenous Continuous Venetia Maxon Rama, MD 125 mL/hr at 11/06/14 2000      Musculoskeletal: Strength & Muscle Tone: within normal limits Gait & Station: normal Patient leans: N/A  Psychiatric Specialty Exam: Physical Exam  ROS  Blood pressure 102/64, pulse 62, temperature 98.1 F (36.7 C), temperature source Oral, resp. rate 14, height '5\' 5"'  (1.651 m), weight 64.864 kg (143 lb), last menstrual period 04/04/2014, SpO2 98 %.Body mass index is 23.8 kg/(m^2).  General Appearance: Guarded  Eye Contact::  Good  Speech:  Clear and Coherent  Volume:  Decreased  Mood:  Depressed and Hopeless  Affect:  Appropriate and Congruent  Thought Process:  Coherent and Goal Directed  Orientation:  Full (Time, Place, and Person)  Thought Content:  WDL  Suicidal Thoughts:  Yes.  with intent/plan  Homicidal Thoughts:  No  Memory:  Immediate;   Good Recent;   Good  Judgement:  Impaired  Insight:  Shallow  Psychomotor Activity:  Decreased  Concentration:  Good  Recall:  Good  Fund of Knowledge:Good  Language: Good  Akathisia:  NA  Handed:  Right  AIMS (if indicated):     Assets:  Communication Skills Desire for Improvement Financial Resources/Insurance Housing Intimacy Leisure Time [place] Talents/Skills Transportation  ADL's:  Intact  Cognition: WNL  Sleep:      Medical Decision Making: New problem, with additional work up planned, Review of Psycho-Social Stressors (1), Review or order clinical lab tests (1), Established Problem, Worsening (2), Review of Medication Regimen & Side Effects (2) and Review of New Medication or Change in Dosage (2)  Treatment Plan Summary: Daily contact with patient to assess and evaluate symptoms and progress in treatment and Medication  management  Plan:  Patient is accepted to behavioral [facility] as per Debarah Crape, [facility] when medically cleared probably today. Agrees with holding psychiatric medication at this time  Recommend psychiatric Inpatient admission when medically cleared. Supportive therapy provided about ongoing stressors.   Disposition: Inpatient acute psychiatric hospitalization for crisis stabilization, safety monitoring on medication management of depression.   Ksean Vale,JANARDHAHA R. 11/07/2014 1:18 PM

## 2014-11-07 NOTE — Progress Notes (Signed)
[facility],  Alert and oriented, discharge instructions given patient verbalize understanding of discharge instructions given, patient in stable condition at this time

## 2014-11-07 NOTE — Tx Team (Addendum)
Initial Interdisciplinary Treatment Plan   PATIENT STRESSORS: Financial difficulties Occupational concerns Traumatic event   PATIENT STRENGTHS: Ability for insight Capable of independent living General fund of knowledge Motivation for treatment/growth   PROBLEM LIST: Problem List/Patient Goals Date to be addressed Date deferred Reason deferred Estimated date of resolution  "3/4 Suicidal" 11/07/14     "Depressed" 11/07/14     "Anxious" 11/07/14           "I need help dealing with my anxiety"                               DISCHARGE CRITERIA:  Ability to meet basic life and health needs Improved stabilization in mood, thinking, and/or behavior Motivation to continue treatment in a less acute level of care  PRELIMINARY DISCHARGE PLAN: Attend PHP/IOP Outpatient therapy Return to previous living arrangement  PATIENT/FAMIILY INVOLVEMENT: This treatment plan has been presented to and reviewed with the patient, Terri Howard.  The patient and family have been given the opportunity to ask questions and make suggestions.  Harold BarbanByrd, Ronecia E 11/07/2014, 3:26 PM

## 2014-11-07 NOTE — Progress Notes (Signed)
Admission Note  D: Patient appropriate and cooperative with staff. She reported that she had taken a bottle and a half of Doxepin and Lamictal after being asked to resign from her current work position. Patient verbalized that her actions were 3/4 an suicide attempt. Stressors are job, finances and her inability to maintain a stable, consistent mood.  A: Support and encouragement provided to patient. Oriented patient to the unit and informed her of the hospital's rules/policies. Initiated Q15 minute checks for safety.  R: Patient receptive. Denies SI/HI and AVH at this time. Patient remains safe on the unit.

## 2014-11-07 NOTE — Discharge Summary (Signed)
Physician Discharge Summary  Viviano SimasSophire Vespa NGE:[id number]:[id number] DOB: [birth year] DOA: 11/05/2014  PCP: Pcp Not In System  Admit date: 11/05/2014 Discharge date: 11/07/2014  Time spent: > 35 minutes  Recommendations for Outpatient Follow-up:  1. Patient will need continued counseling for intentional drug overdose 2. Reassess K levels within the next 1-3 days  Discharge Diagnoses:  Principal Problem:   TCA (tricyclic antidepressant) overdose of undetermined intent Active Problems:   chronic asthma    Overdose   Hypokalemia   Discharge Condition: stable  Diet recommendation: regular  Filed Weights   11/06/14 0000  Weight: 64.864 kg (143 lb)    History of present illness:  According to HPI: 24 y.o. female who found out she was being fired from work after she left today. This made her depressed and so she decided to "halfway" kill her self. She ingested 7 lamotrigines, 5 buspar, and an "unknown" amount of doxepin.   Hospital Course:   TCA (tricyclic antidepressant) overdose of undetermined intent/Overdose  Continue to monitor on tele.   Patient was on bicarb drip. Patient is awake and alert.   Pt successfully weaned off of bicarb drip. Discussed case with poison control and patient medically cleared to transition to behavioral health.  Requested psychiatry consultation.  BuSpar, Sinequan, Lamictal all on hold.  Active Problems:  Hypokalemia  Replete orally and reassess   Asthma  Continue albuterol as needed. Continue Symbicort.  Procedures:  None  Consultations:  Psychiatry  Discharge Exam: Filed Vitals:   11/07/14 0653  BP: 102/64  Pulse: 62  Temp: 98.1 F (36.7 C)  Resp: 14    General: Pt in nad, alert and awake Cardiovascular: rrr, no mrg Respiratory: cta bl, no wheezes  Discharge Instructions   Discharge Instructions    Call MD for:  extreme fatigue    Complete by:  As directed      Call MD for:  temperature >100.4    Complete by:  As  directed      Diet - low sodium heart healthy    Complete by:  As directed      Increase activity slowly    Complete by:  As directed           Current Discharge Medication List    CONTINUE these medications which have NOT CHANGED   Details  albuterol (PROAIR HFA) 108 (90 BASE) MCG/ACT inhaler 2 puffs every 4 hours as needed only  if your can't catch your breath Qty: 1 Inhaler, Refills: 1    budesonide-formoterol (SYMBICORT) 80-4.5 MCG/ACT inhaler Take 2 puffs first thing in am and then another 2 puffs about 12 hours later. Qty: 1 Inhaler, Refills: 11      STOP taking these medications     busPIRone (BUSPAR) 10 MG tablet      doxepin (SINEQUAN) 25 MG capsule      lamoTRIgine (LAMICTAL) 25 MG tablet      predniSONE (DELTASONE) 10 MG tablet        No Known Allergies    The results of significant diagnostics from this hospitalization (including imaging, microbiology, ancillary and laboratory) are listed below for reference.    Significant Diagnostic Studies: No results found.  Microbiology: Recent Results (from the past 240 hour(s))  MRSA PCR Screening     Status: None   Collection Time: 11/06/14 12:42 AM  Result Value Ref Range Status   MRSA by PCR NEGATIVE NEGATIVE Final    Comment:        The GeneXpert MRSA  Assay (FDA approved for NASAL specimens only), is one component of a comprehensive MRSA colonization surveillance program. It is not intended to diagnose MRSA infection nor to guide or monitor treatment for MRSA infections.      Labs: Basic Metabolic Panel:  Recent Labs Lab 11/05/14 2013 11/05/14 2314 11/06/14 0401  NA 141  --  141  K 3.4*  --  3.1*  CL 110  --  107  CO2 24  --  30  GLUCOSE 107*  --  93  BUN 9  --  7  CREATININE 0.85  --  0.75  CALCIUM 8.9  --  7.6*  MG  --  2.2  --    Liver Function Tests:  Recent Labs Lab 11/05/14 2013  AST 20  ALT 15  ALKPHOS 80  BILITOT 0.5  PROT 6.9  ALBUMIN 4.0   No results for  input(s): LIPASE, AMYLASE in the last 168 hours. No results for input(s): AMMONIA in the last 168 hours. CBC:  Recent Labs Lab 11/05/14 2013 11/06/14 0401  WBC 5.5 4.8  HGB 14.5 12.5  HCT 44.6 39.4  MCV 92.7 92.5  PLT 190 169   Cardiac Enzymes: No results for input(s): CKTOTAL, CKMB, CKMBINDEX, TROPONINI in the last 168 hours. BNP: BNP (last 3 results) No results for input(s): BNP in the last 8760 hours.  ProBNP (last 3 results) No results for input(s): PROBNP in the last 8760 hours.  CBG: No results for input(s): GLUCAP in the last 168 hours.     Signed:  Penny Pia  [facility] 11/07/2014, 12:00 PM

## 2014-11-07 NOTE — Progress Notes (Signed)
Pt is a new admit to the hall this afternoon.  Pt reports she was very nervous about coming to the unit, but she is feeling better about it now and her anxiety is decreasing.  She states she feels safe here and will come to staff if she has negative thoughts or self harm thoughts. She hopes her stay will be a short one.   Pt denies SI/HI/AV at the time of assessment.  She has spent most of there time in her room, but she did attend group and has been seen interacting with peers for a short time.  Pt was encouraged to make her needs known to staff.  Support and encouragement offered.  Safety maintained with q 15 minute checks.

## 2014-11-07 NOTE — Progress Notes (Signed)
Adult Psychoeducational Group Note  Date:  11/07/2014 Time:  9:07 PM  Group Topic/Focus:  Wrap-Up Group:   The focus of this group is to help patients review their daily goal of treatment and discuss progress on daily workbooks.  Participation Level:  Active  Participation Quality:  Appropriate  Affect:  Appropriate  Cognitive:  Appropriate  Insight: Appropriate  Engagement in Group:  Engaged  Modes of Intervention:  Discussion  Additional Comments:  Pt attended wrap-up group this evening. Pt described her day as being "pretty good". Pt goal for today was to be discharge. Pt stated she is still working on leaving. Pt shared she was happy her mother came to visit her tonight. Pt was pleasant and appropriate in group.   Mack Alvidrez A 11/07/2014, 9:07 PM

## 2014-11-08 DIAGNOSIS — R45851 Suicidal ideations: Secondary | ICD-10-CM

## 2014-11-08 DIAGNOSIS — T50902A Poisoning by unspecified drugs, medicaments and biological substances, intentional self-harm, initial encounter: Secondary | ICD-10-CM

## 2014-11-08 MED ORDER — BUSPIRONE HCL 10 MG PO TABS
10.0000 mg | ORAL_TABLET | Freq: Two times a day (BID) | ORAL | Status: DC
  Administered 2014-11-08 – 2014-11-10 (×4): 10 mg via ORAL
  Filled 2014-11-08 (×2): qty 14
  Filled 2014-11-08: qty 1
  Filled 2014-11-08: qty 14
  Filled 2014-11-08 (×3): qty 1
  Filled 2014-11-08: qty 14
  Filled 2014-11-08: qty 2
  Filled 2014-11-08 (×2): qty 1
  Filled 2014-11-08: qty 2

## 2014-11-08 MED ORDER — LAMOTRIGINE 25 MG PO TABS
25.0000 mg | ORAL_TABLET | Freq: Every day | ORAL | Status: DC
  Administered 2014-11-08 – 2014-11-10 (×3): 25 mg via ORAL
  Filled 2014-11-08: qty 1
  Filled 2014-11-08: qty 7
  Filled 2014-11-08 (×5): qty 1
  Filled 2014-11-08: qty 7

## 2014-11-08 MED ORDER — FLUOXETINE HCL 10 MG PO CAPS
10.0000 mg | ORAL_CAPSULE | Freq: Every day | ORAL | Status: DC
  Administered 2014-11-08 – 2014-11-10 (×3): 10 mg via ORAL
  Filled 2014-11-08: qty 7
  Filled 2014-11-08: qty 1
  Filled 2014-11-08: qty 7
  Filled 2014-11-08 (×5): qty 1

## 2014-11-08 NOTE — [facility] Group Notes (Signed)
[facility] LCSW Group Therapy  [facility] 1:15 - 2:30 PM  11/08/2014 2:48 PM   Type of Therapy:  Group Therapy  Participation Level: Active  Participation Quality:  Attentive  Affect:  Appropriate  Cognitive:  Appropriate  Insight:  Developing/Improving   Engagement in Therapy:  Developing/Improving   Modes of Intervention:  Discussion, Education, Exploration, Problem-Solving, Rapport Building, Support   Summary of Progress/Problems:   Patient was attentive to speaker from the [facility] as he shared his story of dealing with mental health/substance abuse issues and overcoming it by working a recovery program.  Patient expressed interest in their programs and services and received information on their agency.  Patient asked questions about the family/friends support group.  Wynn BankerHodnett, Dewanna Hurston Hairston 11/08/2014 2:48 PM

## 2014-11-08 NOTE — Progress Notes (Signed)
[facility] Group Notes:  (Nursing/MHT/Case Management/Adjunct)  Date:  11/08/2014  Time:  2030 Type of Therapy:  wrap up group  Participation Level:  Active  Participation Quality:  Appropriate, Attentive, Sharing and Supportive  Affect:  Anxious and Appropriate  Cognitive:  Appropriate  Insight:  Improving  Engagement in Group:  Engaged  Modes of Intervention:  Clarification, Education and Support  Summary of Progress/Problems: Pt reports being consumed with work and a toddler. Pt reports a bad work environment with too heavy a work load and Medical sales representativedisrespective coworkers. Pt was asked to resign at work and feels embarrased to return.   Shelah LewandowskySquires, Domonick Sittner Carol 11/08/2014, 10:27 PM

## 2014-11-08 NOTE — H&P (Signed)
Psychiatric Admission Assessment Adult  Patient Identification: Terri Howard MRN:  [id number] Date of Evaluation:  11/08/2014 Chief Complaint:  MAJOR DEPRESSIVE DISORDER,RECURRENT,SEVERE Principal Diagnosis: Depression Diagnosis:   Patient Active Problem List   Diagnosis Date Noted  . Suicide attempt by drug ingestion [T50.902A]   . Depression [F32.9] 11/07/2014  . Hypokalemia [E87.6] 11/06/2014  . Overdose [T50.901A] 11/05/2014  . TCA (tricyclic antidepressant) overdose of undetermined intent [T43.014A] 11/05/2014  . Hyperglycemia [R73.9] 07/30/2012  . chronic asthma  [J45.40] 08/23/2011   History of Present Illness: HPI: Terri Howard is a 24 y.o. female seen and chart reviewed for psychiatric consultation and evaluation of depression with the status post suicide attempt by taking overdose on prescription medication while intoxicated with alcohol. Patient reported she lost her job which is a major stress and trigger for her increased symptoms of depression and suicidal attempt when she thought about she cannot provide for her son without a job. Reportedly patient made several phone calls to say goodbye and then overdosed her multiple medication with intent to end her life.  Patient changed her mind called her son's father and requested medical health. Patient has been outpatient psychiatric patient at [facility]. Patient was not able to see her therapist due to outstanding balance. Reportedly patient likes drinking vodka 1-2 shots every night but denies being alcohol abuse and dependence. Patient denied blackouts are withdrawal seizure or hallucinations. Patient has no known alcohol delirium tremens.  Patient graduated from [employer] 2014 and working in several places since then.  Patient is currently on cardiac monitoring as per the poison control recommendation.  Patient minimizes her current suicidal thoughts saying that she needed to be there for her son who is 42  years old and her son's father is coparenting along with her.  She sees Dr Darleene Cleaver as an outpatient.  She admits to abusing ETOH and THC.  She is also non-compliant with some of her meds.    To note medical history: This is a 24 years old patient who found out she was being fired from work after she left today. This made her depressed and so she decided to "halfway" kill her self. She ingested 7 lamotrigines, 5 buspar, and an "unknown" amount of doxepin. Her initial EKG had a QRS of 86, this worsened to 98 in the ED and was associated with SBPs in the 80s. She was given a bolus of sodium bicarb (1 amp) and her EKG QRS improved back to 86 as did her BPs which improved to the 90s.  Elements:  Location:  Depression, substance abuse. Quality:  Hopeless. Severity:  Severe. Timing:  In the last few days. Duration:  Chronic. Context:  "I was just asked to resign by my boss.  I didn't expect it.  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   Associated Signs/Symptoms: Depression Symptoms:  depressed mood, hopelessness, (Hypo) Manic Symptoms:  Irritable Mood, Labiality of Mood, Anxiety Symptoms:  Excessive Worry, Psychotic Symptoms:  NA PTSD Symptoms: NA Total Time spent with patient: 30 minutes  Past Medical History:  Past Medical History  Diagnosis Date  . Asthma   . Chlamydia   . Urinary tract infection, recurrent   . 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 Breast lumpectomy  2000    fibroadenma   Family History:  Family History  Problem Relation Age of Onset  . Ovarian cancer Mother   . Heart disease Mother   . Hypertension  Mother   . Mental illness Mother   . Hypertension Father   . Hypertension Maternal Aunt   . Hypertension Maternal Uncle   . Hypertension Maternal Grandmother   . Kidney disease Maternal Grandmother   . COPD Maternal Grandmother   . Breast cancer Maternal Grandmother   . Hypertension Maternal Grandfather     Social History:  History  Alcohol Use  . Yes    Comment: occasional     History  Drug Use No    History   Social History  . Marital Status: Single    Spouse Name: N/A    Number of Children: N/A  . Years of Education: N/A   Social History Main Topics  . Smoking status: Never Smoker   . Smokeless tobacco: Never Used  . Alcohol Use: Yes     Comment: occasional  . Drug Use: No  . Sexual Activity: Yes    Birth Control/ Protection: IUD     Comment: mirena   Other Topics Concern  . 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  Musculoskeletal: Strength & Muscle Tone: within normal limits Gait & Station: normal Patient leans: N/A  Psychiatric Specialty Exam: Physical Exam  Vitals reviewed. Psychiatric: Her mood appears anxious. Her affect is labile. She exhibits a depressed mood.    Review of Systems  Constitutional: Negative.   HENT: Negative.   Eyes: Negative.   Respiratory: Negative.   Cardiovascular: Negative.   Gastrointestinal: Negative.   Genitourinary: Negative.   Musculoskeletal: Negative.   Skin: Negative.   Neurological: Negative.   Endo/Heme/Allergies: Negative.   Psychiatric/Behavioral: Positive for depression and substance abuse. The patient is nervous/anxious.     Blood pressure 98/62, pulse 74, temperature 98.7 F (37.1 C), temperature source Oral, resp. rate 16, height 5' 4.25" (1.632 m), weight 64.864 kg (143 lb), last menstrual period 04/04/2014.Body mass index is 24.35 kg/(m^2).   General Appearance: Guarded  Eye Contact:: Good  Speech: Clear and Coherent  Volume: Decreased  Mood: Depressed and Hopeless  Affect: Appropriate and Congruent  Thought Process: Coherent and Goal Directed  Orientation: Full (Time, Place, and Person)  Thought Content: WDL  Suicidal Thoughts: Yes. with intent/plan  Homicidal Thoughts: No  Memory:  Immediate; Good Recent; Good  Judgement: Impaired  Insight: Shallow  Psychomotor Activity: Decreased  Concentration: Good  Recall: Good  Fund of Knowledge:Good  Language: Good  Akathisia: NA  Handed: Right  AIMS (if indicated):    Assets: Communication Skills Desire for Improvement Financial Resources/Insurance Housing Intimacy Leisure Time Physical Health Resilience Social Support Talents/Skills Transportation  ADL's: Intact  Cognition: WNL  Sleep:  6   Risk to Self: Is patient at risk for suicide?: Yes What has been your use of drugs/alcohol within the last 12 months?: Patient reports drinking a bottle of wine over the weekend Risk to Others:   Prior Inpatient Therapy:   Prior Outpatient Therapy:    Alcohol Screening: 1. How often do you have a drink containing alcohol?: 2 to 4 times a month 2. How many drinks containing alcohol do you have on a typical day when you are drinking?: 5 or 6 3. How often do you have six or more drinks on one occasion?: Never Preliminary Score: 2 4. How often during the last year have you found that you were not able to stop drinking once you had started?: Never 5. How often during the  last year have you failed to do what was normally expected from you becasue of drinking?: Never 6. How often during the last year have you needed a first drink in the morning to get yourself going after a heavy drinking session?: Never 7. How often during the last year have you had a feeling of guilt of remorse after drinking?: Monthly 8. How often during the last year have you been unable to remember what happened the night before because you had been drinking?: Never 9. Have you or someone else been injured as a result of your drinking?: No 10. Has a relative or friend or a doctor or another health worker been concerned about your drinking or suggested you cut down?: No Alcohol Use Disorder Identification Test Final Score  (AUDIT): 6 Brief Intervention: Patient declined brief intervention  Allergies:  No Known Allergies Lab Results:  Results for orders placed or performed during the hospital encounter of 11/07/14 (from the past 48 hour(s))  TSH     Status: None   Collection Time: 11/07/14  7:48 PM  Result Value Ref Range   TSH 0.602 0.350 - 4.500 uIU/mL    Comment: Performed at [facility]   Current Medications: Current Facility-Administered Medications  Medication Dose Route Frequency Provider Last Rate Last Dose  . acetaminophen (TYLENOL) tablet 650 mg  650 mg Oral Q6H PRN Janett Labella, NP      . alum & mag hydroxide-simeth (MAALOX/MYLANTA) 200-200-20 MG/5ML suspension 30 mL  30 mL Oral Q4H PRN Janett Labella, NP      . magnesium hydroxide (MILK OF MAGNESIA) suspension 30 mL  30 mL Oral Daily PRN Freda Munro May Agustin, NP      . traZODone (DESYREL) tablet 50 mg  50 mg Oral QHS PRN Freda Munro May Agustin, NP   50 mg at 11/07/14 2136   PTA Medications: Prescriptions prior to admission  Medication Sig Dispense Refill Last Dose  . albuterol (PROAIR HFA) 108 (90 BASE) MCG/ACT inhaler 2 puffs every 4 hours as needed only  if your can't catch your breath 1 Inhaler 1 unknown  . budesonide-formoterol (SYMBICORT) 80-4.5 MCG/ACT inhaler Take 2 puffs first thing in am and then another 2 puffs about 12 hours later. (Patient taking differently: Inhale 2 puffs into the lungs 2 (two) times daily as needed (short of breath). ) 1 Inhaler 11 unknown   Previous Psychotropic Medications: Yes   Substance Abuse History in the last 12 months:  Yes.    Consequences of Substance Abuse: NA  Results for orders placed or performed during the hospital encounter of 11/07/14 (from the past 72 hour(s))  TSH     Status: None   Collection Time: 11/07/14  7:48 PM  Result Value Ref Range   TSH 0.602 0.350 - 4.500 uIU/mL    Comment: Performed at [facility]    Observation Level/Precautions:  15 minute checks   Laboratory:  PER ed  Psychotherapy:  Group therapy  Medications:  As per medlist  Consultations:  As needed  Discharge Concerns:  Safety  Estimated LOS:  5-7 days  Other:     Psychological Evaluations: Yes   Treatment Plan Summary: Daily contact with patient to assess and evaluate symptoms and progress in treatment and Medication management  Lamictal 25 mg QD Buspar 10 mg BID Prozac 10 mg QD  Medical Decision Making:  Established Problem, Stable/Improving (1), Review of Psycho-Social Stressors (1), Discuss test with performing physician (1), Review of Last Therapy Session (1)  and Review of Medication Regimen & Side Effects (2)  I certify that inpatient services furnished can reasonably be expected to improve the patient's condition.   Kerrie Buffalo 2/4/20166:22 PM   I have discussed case with NP and have met with patient. Agree with NP's Note, Assessment, Plan. 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 She also describes a history of alcohol consumption which has gradually increased in frequency and quantity. Her medication overdose occurred in the context of alcohol intoxication.  At this time not suicidal , not psychotic, and contracting for safety on unit. Insight regarding alcohol abuse issues is limited. Dx- Depression NOS, consider Adjustment Disorder with Depressed Mood, Alcohol Abuse by History. Plan, continue Lamictal, Buspar , Prozac, which she had been taking prior to admission. Would D/C Tricyclic she was taking prior to admission ( due in part to side effect profile and  to concerns about  Increased potential for  lethality in the event of overdose .)
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
## 2014-11-08 NOTE — Progress Notes (Signed)
D: Patient presents with blunted affect and depressed/anxious mood. She reported on the self inventory sheet that she's sleeping fair at night, appetite and ability to concentrate are both good and energy level is normal. Writer has observed that the patient has been resting in bed the majority of the morning. She voiced feeling "bored" and "ready to go". Patient rates depression "8", feelings of hopelessness "10" and anxiety "7". No medications to be given at this time.  A: Support and encouragement provided to patient. Maintain Q15 minute checks for safety.  R: Patient receptive. Denies SI/HI and AVH. Patient remains safe on the hall.

## 2014-11-08 NOTE — BHH Suicide Risk Assessment (Signed)
[facility]   Nursing information obtained from:    Demographic factors:   24 year old single female, employed, has 26 year old daughter  Current Mental Status:   See below Loss Factors:    recent work related stress- supervisor asked her to resign Historical Factors:   denies prior formal psychiatric history- endorses pattern of alcohol abuse  Risk Reduction Factors:   sense or responsibility for family Total Time spent with patient: 45 minutes Principal Problem: acute depression following stressor as above. Alcohol Abuse. Impulsive overdose on medications Diagnosis:   Patient Active Problem List   Diagnosis Date Noted  . Depression [F32.9] 11/07/2014  . Hypokalemia [E87.6] 11/06/2014  . Overdose [T50.901A] 11/05/2014  . TCA (tricyclic antidepressant) overdose of undetermined intent [T43.014A] 11/05/2014  . Hyperglycemia [R73.9] 07/30/2012  . chronic asthma  [J45.40] 08/23/2011     Continued Clinical Symptoms:  Alcohol Use Disorder Identification Test Final Score (AUDIT): 6 The "Alcohol Use Disorders Identification Test", Guidelines for Use in Primary Care, Second Edition.  World Science writerHealth Organization [facility](WHO). Score between 0-7:  no or low risk or alcohol related problems. Score between 8-15:  moderate risk of alcohol related problems. Score between 16-19:  high risk of alcohol related problems. Score 20 or above:  warrants further diagnostic evaluation for alcohol dependence and treatment.   CLINICAL FACTORS:  Patient states she was told by her supervisor to think about resigning. Prior to this was " overworked" but not suicidal or depressed. After this event felt acutely dejected, started drinking ( close to a bottle of wine)  and impulsively overdosed on prescribed medications. Reports  A pattern of episodic heavy drinking.   Musculoskeletal: Strength & Muscle Tone: within normal limits- no tremors, no diaphoresis, does not appear to be in any acute  distress or withdrawal at this time Gait & Station: normal Patient leans: N/A  Psychiatric Specialty Exam: Physical Exam  Review of Systems  Constitutional: Negative for fever and chills.  Respiratory: Negative for cough and shortness of breath.   Cardiovascular: Negative for chest pain.  Gastrointestinal: Negative for nausea, vomiting and blood in stool.  Genitourinary: Negative for dysuria, urgency and frequency.  Skin: Negative for rash.  Neurological: Negative for seizures, loss of consciousness and headaches.  Psychiatric/Behavioral: Positive for depression, suicidal ideas and substance abuse.    Blood pressure 98/62, pulse 74, temperature 98.7 F (37.1 C), temperature source Oral, resp. rate 16, height 5' 4.25" (1.632 m), weight 143 lb (64.864 kg), last menstrual period 04/04/2014.Body mass index is 24.35 kg/(m^2).  General Appearance: Well Groomed  Patent attorneyye Contact::  Good  Speech:  Normal Rate  Volume:  Normal  Mood:  at this time she states her mood is " better"  Affect:  Appropriate and reactive.  Initially somewhat irritable, guarded, but this improved quickly as session progressed   Thought Process:  Linear  Orientation:  Full (Time, Place, and Person)  Thought Content:  no hallucinations, no delusions, ruminative about recent stressors as above   Suicidal Thoughts:  No at this time denies any thoughts of hurting self and  contracts for safety on unit   Homicidal Thoughts:  No  Memory:  Recent and Remote grossly intact  Judgement:  Fair  Insight:  Fair  Psychomotor Activity:  Normal  Concentration:  Good  Recall:  Good  Fund of Knowledge:Good  Language: Good  Akathisia:  Negative  Handed:  Right  AIMS (if indicated):     Assets:  Communication Skills Desire  for Improvement Social Support Talents/Skills Vocational/Educational  Sleep:  Number of Hours: 6.5  Cognition: WNL  ADL's: fair      COGNITIVE FEATURES THAT CONTRIBUTE TO RISK:  Closed-mindedness     SUICIDE RISK:   Moderate:  Frequent suicidal ideation with limited intensity, and duration, some specificity in terms of plans, no associated intent, good self-control, limited dysphoria/symptomatology, some risk factors present, and identifiable protective factors, including available and accessible social support.  PLAN OF CARE: Patient will be admitted to inpatient psychiatric unit for stabilization and safety. Will provide and encourage milieu participation. Provide medication management and maked adjustments as needed.  Will follow daily.     Medical Decision Making:  Review of Psycho-Social Stressors (1), Review or order clinical lab tests (1), Established Problem, Worsening (2) and Review of Medication Regimen & Side Effects (2)  I certify that inpatient services furnished can reasonably be expected to improve the patient's condition.   Ceniya Fowers 11/08/2014, 2:43 PM

## 2014-11-09 NOTE — BHH Group Notes (Signed)
[facility]   11/09/2014 1:08 PM    Participation Quality:  Appropraite  Mood/Affect:  Appropriate  Depression Rating:  0  Anxiety Rating:  0  Thoughts of Suicide:  No  Will you contract for safety?   NA  Current AVH:  No  Plan for Discharge/Comments:  Patient attended discharge planning group and actively participated in group. She reports doing well and hopes to discharge soon.  Patient will follow up with [facility] for outpatient treatment.  Suicide prevention education reviewed and SPE document provided.   Transportation Means: Patient has transportation.   Supports:  Patient has a support system.   Jordyn Doane, Joesph JulyQuylle Hairston

## 2014-11-09 NOTE — Progress Notes (Signed)
  [facility] :  Will you be returning to the same living situation after discharge:  Yes,  Patient is returning to her home. At discharge, do you have transportation home?: Yes,  Patient to arrange transportation home. Do you have the ability to pay for your medications: Yes,  Patient able to obtain medicatons.  Release of information consent forms completed and in the chart;  Patient's signature needed at discharge.  Patient to Follow up at: Follow-up Information    Follow up with [facility]  On 11/15/2014.   Why:  You are scheduled with Leone Payorrystal [facility] on Thursday, November 15, 2014 at 5:30 PM   Contact information:   [address]  [place], [place]   [id number]  [phone]      Follow up with [facility].   Why:  Patient to make contact for follow up with [facility] at a later date   Contact information:   [address] [place], [place]   [id number]  [phone]      Patient denies SI/HI: Patient no longer endorsing SI/HI or other thoughts of self harm.   Safety Planning and Suicide Prevention discussed: .Reviewed with all patients during discharge planning group   Has patient been referred to the Quitline?: No referral to Quitline needed. Wynn BankerHodnett, Berta Denson Hairston 11/09/2014, 12:56 PM

## 2014-11-09 NOTE — Progress Notes (Signed)
D: Patient denies SI/HI and A/V hallucinations; patient reports sleep is fair; reports appetite is good; reports energy level is high ; reports ability to concentrate is  good; rates depression as 4/10; rates hopelessness 0/10; rates anxiety as 4/10;   A: Monitored q 15 minutes; patient encouraged to attend groups; patient educated about medications; patient given medications per physician orders; patient encouraged to express feelings and/or concerns  R: Patient is cooperative and appropriate to circumstances; patient seems to be cautious and superficial in conversation at times; patient's interaction with staff and peers is assertive; patient was able to set goal to talk with staff 1:1 when having feelings of SI; patient is taking medications as prescribed and tolerating medications; patient is attending all groups

## 2014-11-09 NOTE — Progress Notes (Signed)
D: Pt denies SI/HI/AVH. Pt is pleasant and cooperative. Pt has been observed on unit interacting with peers .   A: Pt was offered support and encouragement. Pt was given scheduled medications. Pt was encourage to attend groups. Q 15 minute checks were done for safety.   R:Pt attends groups and interacts well with peers and staff. Pt is taking medication. Pt has no complaints at this time .Pt receptive to treatment and safety maintained on unit.

## 2014-11-09 NOTE — Clinical Social Work Note (Signed)
CSW spoke with patient's mother, Terri Howard, who was asking for assistance in filing a Workmen's Compensation claim against employer for patient being admitted to the hospital for mental health concerns.  Mother advised she believes patient is being harassed by her employer.  Mother was informed that CSW is unfamiliar with filing for mental health and that she and/or patient may want to consult with an attorney before proceeding with a claim. She stated she had started a claim and wanted CSW to assistance with completing forms.  She was informed we would not be involved and again, suggested she follow up with an attorney.  CSW consulted with Lead SW to make sure mother had been given correct information.  Lead SW agreed mother/patient should begin process with other community resources.

## 2014-11-09 NOTE — [facility] Group Notes (Signed)
[facility] LCSW Group Therapy  Feelings Around Relapse 1:15 -2:30        11/09/2014 3:32 PM   Type of Therapy:  Group Therapy  Participation Level:  Appropriate  Participation Quality:  Appropriate  Affect:  Appropriate  Cognitive:  Attentive Appropriate  Insight:  Developing/Improving  Engagement in Therapy: Developing/Improving  Modes of Intervention:  Discussion Exploration Problem-Solving Supportive  Summary of Progress/Problems:  The topic for today was feelings around relapse.  Patient processed feelings toward relapse and was able to relate to peers.  Patient shared relapsing for her would be to stop taking her medication.  Patient shared she begins to feel better and decides she does not need the medications.  Patient identified coping skills that can be used to prevent a relapse including staying on medications and spending time with positive supports.   Wynn BankerHodnett, Jearldine Cassady Hairston 11/09/2014 3:32 PM

## 2014-11-09 NOTE — [facility] Group Notes (Signed)
Adult Psychoeducational Group Note  Date:  11/09/2014 Time:  9:07 PM  Group Topic/Focus:  Wrap-Up Group:   The focus of this group is to help patients review their daily goal of treatment and discuss progress on daily workbooks.  Participation Level:  Active  Participation Quality:  Attentive  Affect:  Appropriate  Cognitive:  Appropriate  Insight: Good  Engagement in Group:  Engaged  Modes of Intervention:  Discussion  Additional Comments:  Shataya said her day was good.  She played basketball with her peers and attended some "good eye opening group sessions".  Her goal was to write.  She is discharging to go home tomorrow.  Caroll RancherLindsay, Talibah Colasurdo A 11/09/2014, 9:07 PM

## 2014-11-09 NOTE — [facility] Suicide Risk Assessment (Signed)
[facility] INPATIENT:  Family/Significant Other Suicide Prevention Education  Suicide Prevention Education:  Education Completed; Terri Howard, Mother, [phone];  has been identified by the patient as the family member/significant other with whom the patient will be residing, and identified as the person(s) who will aid the patient in the event of a mental health crisis (suicidal ideations/suicide attempt).  With written consent from the patient, the family member/significant other has been provided the following suicide prevention education, prior to the and/or following the discharge of the patient.  The suicide prevention education provided includes the following:  Suicide risk factors  Suicide prevention and interventions  National Suicide Hotline telephone number  [facility] assessment telephone number  [facility] Assistance 911  [facility] and/or [facility] telephone number  Request made of family/significant other to:  Remove weapons (e.g., guns, rifles, knives), all items previously/currently identified as safety concern.   Mother advised patient does not have access to weapons.     Remove drugs/medications (over-the-counter, prescriptions, illicit drugs), all items previously/currently identified as a safety concern.  The family member/significant other verbalizes understanding of the suicide prevention education information provided.  The family member/significant other agrees to remove the items of safety concern listed above.  Terri Howard, Terri Howard 11/09/2014, 12:55 PM

## 2014-11-09 NOTE — Tx Team (Signed)
Interdisciplinary Treatment Plan Update   Date Reviewed:  11/09/2014  Time Reviewed:  8:44 AM  Progress in Treatment:   Attending groups: Yes Participating in groups: Yes, patient has attend groups. Taking medication as prescribed: Yes  Tolerating medication: Yes Family/Significant other contact made: Yes, SPE completed with mother. Patient understands diagnosis:Yes, patient understands diagnosis and need for treatment. Discussing patient identified problems/goals with staff: Yes, patient is able to express goals for treatment and discharge. Medical problems stabilized or resolved: Yes Denies suicidal/homicidal ideation: Yes Patient has not harmed self or others: Yes  For review of initial/current patient goals, please see plan of care.  Estimated Length of Stay:  Discharge Saturday  Reasons for Continued Hospitalization:  Anxiety Depression Medication stabilization   New Problems/Goals identified:    Discharge Plan or Barriers:   Home with outpatient follow up with Dr. Jannifer FranklinAkintayo  Additional Comments:  Continue medication stabilization  Patient and CSW reviewed patient's identified goals and treatment plan.  Patient verbalized understanding and agreed to treatment plan.   Attendees:  Patient:  11/09/2014 8:44 AM   Signature:  Sallyanne HaversF. Cobos, MD 11/09/2014 8:44 AM  Signature: Geoffery LyonsIrving Lugo, MD 11/09/2014 8:44 AM  Signature:  Vicie MuttersSusan Michel,  RN 11/09/2014 8:44 AM  Signature:  Michaelle BirksBritney Guthrie, RN 11/09/2014 8:44 AM  Signature:   11/09/2014 8:44 AM  Signature:  Juline PatchQuylle Elzia Hott, LCSW 11/09/2014 8:44 AM  Signature:  Samuella BruinKristin Drinkard, LCSW-A 11/09/2014 8:44 AM  Signature:  Leisa LenzValerie Enoch, Care Coordinator [facility] 11/09/2014 8:44 AM  Signature:   11/09/2014 8:44 AM  Signature: Leighton ParodyBritney Tyson, RN 11/09/2014  8:44 AM  Signature:   Onnie BoerJennifer Clark, RN [facility] 11/09/2014  8:44 AM  Signature:  Santa GeneraAnne Cunningham Lead Social Worker LCSW 11/09/2014  8:44 AM    Scribe for Treatment Team:   Juline PatchQuylle Olanda Downie,  11/09/2014 8:44 AM

## 2014-11-09 NOTE — Progress Notes (Signed)
D: Pt denies SI/HI/AVH. Pt is pleasant and cooperative. Pt has no insight into her problem and the things she can do to help her situation. Pt appears to want to change and appears to have the desire for changing her situation.   A: Pt was offered support and encouragement. Pt was given scheduled medications. Pt was encourage to attend groups. Q 15 minute checks were done for safety.   R:Pt attends groups and interacts well with peers and staff. Pt is taking medication. Pt has no complaints at this time.Pt receptive to treatment and safety maintained on unit.

## 2014-11-09 NOTE — Progress Notes (Signed)
[facility] MD Progress Note  11/09/2014 12:12 PM Terri Howard  MRN:  [id number] Subjective:  Patient is " feeling better today". She reports she is not having any side effects from her current medications. Objective: I have discussed case with treatment team and have met with patient. Patient is improved, but reports some mild, relatively subtle symptoms of depression. For example, she states that she sometimes feels sad and tearful for no apparent reason. She also has tended to question her ability to perform at work, and feels " I should be more organized, better", which was part of the reason supervisor's negative review was emotionally difficult for her. She denies any ongoing suicidal ideations, and today presents with a full range of affect. We also discussed issues regarding alcohol abuse- patient agrees she has been drinking more often, and in more quantity than before, and today states overdose might not have occurred if she had not consumed alcohol. Of note, answers 3/4 CAGE questions affirmatively ( all except E- does not drink in AMs).  We discussed the importance of considering full abstinence from alcohol as part of treatment goals. She denies any current cravings. She denies any current withdrawal symptoms and does not present with tremors or diaphoresis and is not in any acute distress.  Denies medication side effects. She feels current medication combination is helpful and has helped her mood . TSH WNL. Principal Problem: Depression Diagnosis:   Patient Active Problem List   Diagnosis Date Noted  . Suicide attempt by drug ingestion [T50.902A]   . Depression [F32.9] 11/07/2014  . Hypokalemia [E87.6] 11/06/2014  . Overdose [T50.901A] 11/05/2014  . TCA (tricyclic antidepressant) overdose of undetermined intent [T43.014A] 11/05/2014  . Hyperglycemia [R73.9] 07/30/2012  . chronic asthma  [J45.40] 08/23/2011   Total Time spent with patient: 25 minutes    Past Medical History:  Past  Medical History  Diagnosis Date  . Asthma   . Chlamydia   . Urinary tract infection, recurrent   . 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 Breast lumpectomy  2000    fibroadenma   Family History:  Family History  Problem Relation Age of Onset  . Ovarian cancer Mother   . Heart disease Mother   . Hypertension Mother   . Mental illness Mother   . Hypertension Father   . Hypertension Maternal Aunt   . Hypertension Maternal Uncle   . Hypertension Maternal Grandmother   . Kidney disease Maternal Grandmother   . COPD Maternal Grandmother   . Breast cancer Maternal Grandmother   . Hypertension Maternal Grandfather    Social History:  History  Alcohol Use  . Yes    Comment: occasional     History  Drug Use No    History   Social History  . Marital Status: Single    Spouse Name: N/A    Number of Children: N/A  . Years of Education: N/A   Social History Main Topics  . Smoking status: Never Smoker   . Smokeless tobacco: Never Used  . Alcohol Use: Yes     Comment: occasional  . Drug Use: No  . Sexual Activity: Yes    Birth Control/ Protection: IUD     Comment: mirena   Other Topics Concern  . None   Social History Narrative   Additional History:    Sleep: fair   Appetite:  Good    Assessment:   Musculoskeletal: Strength & Muscle Tone: within normal limits-  no tremors, no diaphoresis, no restlessness  Gait & Station: normal Patient leans: N/A   Psychiatric Specialty Exam: Physical Exam  Review of Systems  Constitutional: Negative for fever and chills.  Respiratory: Negative for cough and shortness of breath.   Cardiovascular: Negative for chest pain.  Gastrointestinal: Negative for vomiting.  Skin: Negative for rash.  Neurological: Negative for seizures, loss of consciousness and headaches.  Psychiatric/Behavioral: Positive for substance abuse.    Blood pressure 109/63, pulse 82, temperature 98.5 F (36.9  C), temperature source Oral, resp. rate 16, height 5' 4.25" (1.632 m), weight 143 lb (64.864 kg), last menstrual period 04/04/2014.Body mass index is 24.35 kg/(m^2).  General Appearance: improved grooming   Eye Contact::  Good  Speech:  Normal Rate  Volume:  Normal  Mood:  mood is improved, and at this time denies depression  Affect:  Appropriate  Thought Process:  Goal Directed and Linear  Orientation:  Full (Time, Place, and Person)  Thought Content:  still ruminative about stressors, no delusions, no hallucinations  Suicidal Thoughts:  No denies any thoughts of hurting self   Homicidal Thoughts:  No denies any thoughts of hurting anyone else   Memory:  Recent and remote grossly intact  Judgement:  Fair  Insight:  gradually improving  Psychomotor Activity:  Normal  Concentration:  Good  Recall:  Good  Fund of Knowledge:Good  Language: Good  Akathisia:  Negative  Handed:  Right  AIMS (if indicated):     Assets:  Communication Skills Desire for Improvement Physical Health Resilience Social Support  ADL's:  Improving   Cognition: WNL  Sleep:  Number of Hours: 5.25     Current Medications: Current Facility-Administered Medications  Medication Dose Route Frequency Provider Last Rate Last Dose  . acetaminophen (TYLENOL) tablet 650 mg  650 mg Oral Q6H PRN Janett Labella, NP      . alum & mag hydroxide-simeth (MAALOX/MYLANTA) 200-200-20 MG/5ML suspension 30 mL  30 mL Oral Q4H PRN Janett Labella, NP      . busPIRone (BUSPAR) tablet 10 mg  10 mg Oral BID Freda Munro May Agustin, NP   10 mg at 11/09/14 5056  . FLUoxetine (PROZAC) capsule 10 mg  10 mg Oral Daily Janett Labella, NP   10 mg at 11/09/14 9794  . lamoTRIgine (LAMICTAL) tablet 25 mg  25 mg Oral Daily Janett Labella, NP   25 mg at 11/09/14 8016  . magnesium hydroxide (MILK OF MAGNESIA) suspension 30 mL  30 mL Oral Daily PRN Freda Munro May Agustin, NP      . traZODone (DESYREL) tablet 50 mg  50 mg Oral QHS PRN Freda Munro  May Agustin, NP   50 mg at 11/08/14 2223    Lab Results:  Results for orders placed or performed during the hospital encounter of 11/07/14 (from the past 48 hour(s))  TSH     Status: None   Collection Time: 11/07/14  7:48 PM  Result Value Ref Range   TSH 0.602 0.350 - 4.500 uIU/mL    Comment: Performed at [facility]    Physical Findings: AIMS: Facial and Oral Movements Muscles of Facial Expression: None, normal Lips and Perioral Area: None, normal Jaw: None, normal Tongue: None, normal,Extremity Movements Upper (arms, wrists, hands, fingers): None, normal Lower (legs, knees, ankles, toes): None, normal, Trunk Movements Neck, shoulders, hips: None, normal, Overall Severity Severity of abnormal movements (highest score from questions above): None, normal Incapacitation due to abnormal movements: None, normal Patient's awareness of  abnormal movements (rate only patient's report): No Awareness, Dental Status Current problems with teeth and/or dentures?: No Does patient usually wear dentures?: No  CIWA:    COWS:      Assessment: Patient improved at this time. Not significantly depressed, although still ruminative about work related stressors. She is not suicidal and is not endorsing any violent or homicidal ideations. She is tolerating medications well. She is gaining insight into pattern of alcohol abuse, and seems  Motivated in considering  alcohol abstinence . There are no current symptoms of alcohol withdrawal and vitals are stable.  Treatment Plan Summary: Daily contact with patient to assess and evaluate symptoms and progress in treatment, Medication management, Plan Continue inpatient treatment  and Continue medications as below  Lamictal 25 mgrs QDAY Prozac 10 mgrs QDAY Buspar 10 mgrs BID Treatment team working on disposition planning - discharge soon as she continues to improve, stabilize.   Medical Decision Making:  Established Problem, Stable/Improving (1),  Review or order clinical lab tests (1), Review of Last Therapy Session (1) and Review of Medication Regimen & Side Effects (2)     COBOS, FERNANDO 11/09/2014, 12:12 PM

## 2014-11-10 ENCOUNTER — Encounter (HOSPITAL_COMMUNITY): Payer: Self-pay | Admitting: Registered Nurse

## 2014-11-10 DIAGNOSIS — T1491 Suicide attempt: Secondary | ICD-10-CM

## 2014-11-10 DIAGNOSIS — F329 Major depressive disorder, single episode, unspecified: Secondary | ICD-10-CM

## 2014-11-10 DIAGNOSIS — T50902A Poisoning by unspecified drugs, medicaments and biological substances, intentional self-harm, initial encounter: Secondary | ICD-10-CM

## 2014-11-10 LAB — URINALYSIS, ROUTINE W REFLEX MICROSCOPIC
Bilirubin Urine: NEGATIVE
Glucose, UA: NEGATIVE mg/dL
HGB URINE DIPSTICK: NEGATIVE
KETONES UR: NEGATIVE mg/dL
Leukocytes, UA: NEGATIVE
Nitrite: NEGATIVE
PH: 7.5 (ref 5.0–8.0)
Protein, ur: NEGATIVE mg/dL
Specific Gravity, Urine: 1.02 (ref 1.005–1.030)
Urobilinogen, UA: 0.2 mg/dL (ref 0.0–1.0)

## 2014-11-10 MED ORDER — FLUOXETINE HCL 10 MG PO CAPS: 10.0000 mg | ORAL_CAPSULE | Freq: Every day | ORAL | Status: DC

## 2014-11-10 MED ORDER — LAMOTRIGINE 25 MG PO TABS: 25.0000 mg | ORAL_TABLET | Freq: Every day | ORAL | Status: DC

## 2014-11-10 MED ORDER — BUSPIRONE HCL 10 MG PO TABS: 10.0000 mg | ORAL_TABLET | Freq: Two times a day (BID) | ORAL | Status: DC

## 2014-11-10 MED ORDER — TRAZODONE HCL 50 MG PO TABS: 50.0000 mg | ORAL_TABLET | Freq: Every evening | ORAL | Status: DC | PRN

## 2014-11-10 MED ORDER — BUDESONIDE-FORMOTEROL FUMARATE 80-4.5 MCG/ACT IN AERO: INHALATION_SPRAY | RESPIRATORY_TRACT | Status: DC

## 2014-11-10 MED ORDER — ALBUTEROL SULFATE HFA 108 (90 BASE) MCG/ACT IN AERS: INHALATION_SPRAY | RESPIRATORY_TRACT | Status: DC

## 2014-11-10 NOTE — Discharge Summary (Signed)
Physician Discharge Summary Note  Patient:  Terri Howard is an 24 y.o., female MRN:  [id number] DOB:  Feb 06, 1991 Patient phone:  [phone] (home)  Patient address:   [address] [place] [address],  Total Time spent with patient: Greater than 30 min  Date of Admission:  11/07/2014 Date of Discharge: 11/10/2014  Reason for Admission:  [name] is a 24 y.o. female seen and chart reviewed for psychiatric consultation and evaluation of depression with the status post suicide attempt by taking overdose on prescription medication while intoxicated with alcohol. Patient reported she lost her job which is a major stress and trigger for her increased symptoms of depression and suicidal attempt when she thought about she cannot provide for her son without a job. Reportedly patient made several phone calls to say goodbye and then overdosed her multiple medication with intent to end her life. Patient changed her mind called her son's father and requested medical health. Patient has been outpatient psychiatric patient at [facility] [place]. Patient was not able to see her therapist due to outstanding balance. Reportedly patient likes drinking vodka 1-2 shots every night but denies being alcohol abuse and dependence. Patient denied blackouts are withdrawal seizure or hallucinations. Patient has no known alcohol delirium tremens. Patient graduated from [employer] 2014 and working in several places since then. Patient is currently on cardiac monitoring as per the poison control recommendation. Patient minimizes her current suicidal thoughts saying that she needed to be there for her son who is 63 years old and her son's father is coparenting along with her. She sees Dr Jannifer Franklin as an outpatient. She admits to abusing ETOH and THC. She is also non-compliant with some of her meds.   Principal Problem: Depression Discharge Diagnoses: Patient Active Problem List   Diagnosis  Date Noted  . Suicide attempt by drug ingestion [T50.902A]   . Depression [F32.9] 11/07/2014  . Hypokalemia [E87.6] 11/06/2014  . Overdose [T50.901A] 11/05/2014  . TCA (tricyclic antidepressant) overdose of undetermined intent [T43.014A] 11/05/2014  . Hyperglycemia [R73.9] 07/30/2012  . chronic asthma  [J45.40] 08/23/2011    Musculoskeletal: Strength & Muscle Tone: within normal limits Gait & Station: normal Patient leans: N/A  Psychiatric Specialty Exam:  See Suicide Risk Assessment Physical Exam  Review of Systems  Psychiatric/Behavioral: Negative for suicidal ideas, hallucinations and memory loss. Depression: Stable. Nervous/anxious: Stable. Insomnia: Stable.     Blood pressure 94/56, pulse 99, temperature 98.1 F (36.7 C), temperature source Oral, resp. rate 16, height 5' 4.25" (1.632 m), weight 64.864 kg (143 lb), last menstrual period 04/04/2014.Body mass index is 24.35 kg/(m^2).   Past Medical History:  Past Medical History  Diagnosis Date  . Asthma   . Chlamydia   . Urinary tract infection, recurrent   . 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 Breast lumpectomy  2000    fibroadenma   Family History:  Family History  Problem Relation Age of Onset  . Ovarian cancer Mother   . Heart disease Mother   . Hypertension Mother   . Mental illness Mother   . Hypertension Father   . Hypertension Maternal Aunt   . Hypertension Maternal Uncle   . Hypertension Maternal Grandmother   . Kidney disease Maternal Grandmother   . COPD Maternal Grandmother   . Breast cancer Maternal Grandmother   . Hypertension Maternal Grandfather    Social History:  History  Alcohol Use  . Yes  Comment: occasional     History  Drug Use No    History   Social History  . Marital Status: Single    Spouse Name: N/A    Number of Children: N/A  . Years of Education: N/A   Social History Main Topics  . Smoking status: Never Smoker   .  Smokeless tobacco: Never Used  . Alcohol Use: Yes     Comment: occasional  . Drug Use: No  . Sexual Activity: Yes    Birth Control/ Protection: IUD     Comment: mirena   Other Topics Concern  . None   Social History Narrative   Risk to Self: Is patient at risk for suicide?: Yes What has been your use of drugs/alcohol within the last 12 months?: Patient reports drinking a bottle of wine over the weekend Risk to Others:   Prior Inpatient Therapy:   Prior Outpatient Therapy:    Level of Care:  OP  Hospital Course:    Ellerie Dinning was admitted for Depression and crisis management.  She was treated with Buspar for anxiety; Prozac for depression; Lamictal for mood control and Trazodone for insomnia.  Medical problems were identified and treated as needed.  Home medications were restarted as appropriate.  Improvement was monitored by observation and Toby Makara daily report of symptom reduction.  Emotional and mental status was monitored by daily self inventory reports completed by Viviano Simas and clinical staff.         Lindee Erker was evaluated by the treatment team for stability and plans for continued recovery upon discharge.  She was offered further treatment options upon discharge including Residential, Intensive Outpatient and Outpatient treatment. She will follow up with [facility] for medication management and [facility] for counseling.     Liliyana Safer motivation was an integral factor for scheduling further treatment.  Employment, transportation, bed availability, health status, family support, and any pending legal issues were also considered during her hospital stay.  Upon completion of this admission the patient was both mentally and medically stable for discharge denying suicidal/homicidal ideation, auditory/visual/tactile hallucinations, delusional thoughts and paranoia.       Consults:  psychiatry  Significant Diagnostic Studies:  labs:  Urinalysis, TSH, Venous blood gas, CBC/Diff, CMET, ETOH, UDS  Discharge Vitals:   Blood pressure 94/56, pulse 99, temperature 98.1 F (36.7 C), temperature source Oral, resp. rate 16, height 5' 4.25" (1.632 m), weight 64.864 kg (143 lb), last menstrual period 04/04/2014. Body mass index is 24.35 kg/(m^2). Lab Results:   Results for orders placed or performed during the hospital encounter of 11/07/14 (from the past 72 hour(s))  TSH     Status: None   Collection Time: 11/07/14  7:48 PM  Result Value Ref Range   TSH 0.602 0.350 - 4.500 uIU/mL    Comment: Performed at [facility]  Urinalysis, Routine w reflex microscopic     Status: Abnormal   Collection Time: 11/09/14  3:26 PM  Result Value Ref Range   Color, Urine YELLOW YELLOW   APPearance CLOUDY (A) CLEAR   Specific Gravity, Urine 1.020 1.005 - 1.030   pH 7.5 5.0 - 8.0   Glucose, UA NEGATIVE NEGATIVE mg/dL   Hgb urine dipstick NEGATIVE NEGATIVE   Bilirubin Urine NEGATIVE NEGATIVE   Ketones, ur NEGATIVE NEGATIVE mg/dL   Protein, ur NEGATIVE NEGATIVE mg/dL   Urobilinogen, UA 0.2 0.0 - 1.0 mg/dL   Nitrite NEGATIVE NEGATIVE   Leukocytes, UA NEGATIVE NEGATIVE  Comment: MICROSCOPIC NOT DONE ON URINES WITH NEGATIVE PROTEIN, BLOOD, LEUKOCYTES, NITRITE, OR GLUCOSE <1000 mg/dL. Performed at [facility]     Physical Findings: AIMS: Facial and Oral Movements Muscles of Facial Expression: None, normal Lips and Perioral Area: None, normal Jaw: None, normal Tongue: None, normal,Extremity Movements Upper (arms, wrists, hands, fingers): None, normal Lower (legs, knees, ankles, toes): None, normal, Trunk Movements Neck, shoulders, hips: None, normal, Overall Severity Severity of abnormal movements (highest score from questions above): None, normal Incapacitation due to abnormal movements: None, normal Patient's awareness of abnormal movements (rate only patient's report): No Awareness, Dental Status Current  problems with teeth and/or dentures?: No Does patient usually wear dentures?: No  CIWA:    COWS:      See Psychiatric Specialty Exam and Suicide Risk Assessment completed by Attending Physician prior to discharge.  Discharge destination:  Home  Is patient on multiple antipsychotic therapies at discharge:  No   Has Patient had three or more failed trials of antipsychotic monotherapy by history:  No    Recommended Plan for Multiple Antipsychotic Therapies: NA      Discharge Instructions    Activity as tolerated - No restrictions    Complete by:  As directed      Diet general    Complete by:  As directed      Discharge instructions    Complete by:  As directed   Take all of you medications as prescribed by your mental healthcare provider.  Report any adverse effects and reactions from your medications to your outpatient provider promptly. Do not engage in alcohol and or illegal drug use while on prescription medicines. In the event of worsening symptoms call the crisis hotline, 911, and or go to the nearest emergency department for appropriate evaluation and treatment of symptoms. Follow-up with your primary care provider for your medical issues, concerns and or health care needs.   Keep all scheduled appointments.  If you are unable to keep an appointment call to reschedule.  Let the nurse know if you will need medications before next scheduled appointment.            Medication List    TAKE these medications      Indication   albuterol 108 (90 BASE) MCG/ACT inhaler  Commonly known as:  PROAIR HFA  2 puffs every 4 hours as needed for shortness of breath, wheezing and cough (asthma)   Indication:  Asthma     budesonide-formoterol 80-4.5 MCG/ACT inhaler  Commonly known as:  SYMBICORT  Take 2 puffs first thing in am and then another 2 puffs about 12 hours later.  (for asthma)   Indication:  Asthma     busPIRone 10 MG tablet  Commonly known as:  BUSPAR  Take 1 tablet (10  mg total) by mouth 2 (two) times daily. For anxiety   Indication:  Anxiety Disorder     FLUoxetine 10 MG capsule  Commonly known as:  PROZAC  Take 1 capsule (10 mg total) by mouth daily. For depression   Indication:  Depression     lamoTRIgine 25 MG tablet  Commonly known as:  LAMICTAL  Take 1 tablet (25 mg total) by mouth daily. For mood stabilization   Indication:  mood stabilization     traZODone 50 MG tablet  Commonly known as:  DESYREL  Take 1 tablet (50 mg total) by mouth at bedtime as needed for sleep.   Indication:  Trouble Sleeping  Follow-up Information    Follow up with [facility]  On 11/15/2014.   Why:  You are scheduled with Leone Payor on Thursday, November 15, 2014 at 5:30 PM   Contact information:   [address]  [place], [place]   [address]  [phone]      Follow up with [facility].   Why:  Patient to make contact for follow up with [facility] at a later date   Contact information:   [address] [place], [place]   [address]  [phone]      Follow-up recommendations:  Activity:  As tolerated Diet:  As tolerated  Comments:   Patient has been instructed to take medications as prescribed; and report adverse effects to outpatient provider.  Follow up with primary doctor for any medical issues and If symptoms recur report to nearest emergency or crisis hot line.    Total Discharge Time: Greater than 30 minutes  Signed: Assunta Found, FNP-BC 11/10/2014, 4:53 PM  I have examined the patient and agree with the discharge plan and findings

## 2014-11-10 NOTE — Progress Notes (Signed)
Patient ID: Viviano SimasSophire Howard, female   DOB: 09-12-1991, 24 y.o.   MRN: [id number]   Pt discharged home with her mother, pt reported that she was ready for discharge. Pt reported being negative SI/HI, no AH/VH noted. Pt was given discharge instructions and sample medications.

## 2014-11-10 NOTE — [facility] Suicide Risk Assessment (Signed)
[facility]   Demographic Factors:  Caucasian and female  Total Time spent with patient: 45 minutes  Musculoskeletal: Strength & Muscle Tone: within normal limits Gait & Station: normal Patient leans: Right  Psychiatric Specialty Exam: Physical Exam  Constitutional: She appears well-developed and well-nourished.    ROS  Blood pressure 94/56, pulse 99, temperature 98.1 F (36.7 C), temperature source Oral, resp. rate 16, height 5' 4.25" (1.632 m), weight 64.864 kg (143 lb), last menstrual period 04/04/2014.Body mass index is 24.35 kg/(m^2).  General Appearance: Casual  Eye Contact::  Fair  Speech:  Slow409  Volume:  Normal  Mood:  Euthymic  Affect:  Congruent  Thought Process:  Coherent  Orientation:  Full (Time, Place, and Person)  Thought Content:  Rumination  Suicidal Thoughts:  No  Homicidal Thoughts:  No  Memory:  Immediate;   Fair Recent;   Fair  Judgement:  Fair  Insight:  Shallow  Psychomotor Activity:  Normal  Concentration:  Fair  Recall:  [employer]Fair  Fund of Knowledge:Fair  Language: Fair  Akathisia:  Negative  Handed:  Right  AIMS (if indicated):     Assets:  Communication Skills Desire for Improvement Financial Resources/Insurance  Sleep:  Number of Hours: 6.25  Cognition: WNL  ADL's:  Intact   Have you used any form of tobacco in the last 30 days? (Cigarettes, Smokeless Tobacco, Cigars, and/or Pipes): No  Has this patient used any form of tobacco in the last 30 days? (Cigarettes, Smokeless Tobacco, Cigars, and/or Pipes) No  Mental Status Per Nursing Assessment::   On Admission:     Current Mental Status by Physician: see above  Loss Factors: Decrease in vocational status  Historical Factors: Impulsivity  Risk Reduction Factors:   Positive social support and Positive therapeutic relationship  Continued Clinical Symptoms:  Dysthymia  Cognitive Features That Contribute To Risk:  Closed-mindedness    Suicide Risk:   Minimal: No identifiable suicidal ideation.  Patients presenting with no risk factors but with morbid ruminations; may be classified as minimal risk based on the severity of the depressive symptoms  Principal Problem: Depression Discharge Diagnoses:  Patient Active Problem List   Diagnosis Date Noted  . Suicide attempt by drug ingestion [T50.902A]   . Depression [F32.9] 11/07/2014  . Hypokalemia [E87.6] 11/06/2014  . Overdose [T50.901A] 11/05/2014  . TCA (tricyclic antidepressant) overdose of undetermined intent [T43.014A] 11/05/2014  . Hyperglycemia [R73.9] 07/30/2012  . chronic asthma  [J45.40] 08/23/2011    Follow-up Information    Follow up with [facility]  On 11/15/2014.   Why:  You are scheduled with Leone Payorrystal Montague on Thursday, November 15, 2014 at 5:30 PM   Contact information:   [address]  [place], [place]   [id number]  [phone]      Follow up with [facility].   Why:  Patient to make contact for follow up with [facility] at a later date   Contact information:   [address] [place], [place]   [address]  [phone]      Plan Of Care/Follow-up recommendations:  Activity:  as tolerated Diet:  regular See Discharge summary for details. Recommend compliance with medications and follow up. Patient not suicidal . Is patient on multiple antipsychotic therapies at discharge:  No   Has Patient had three or more failed trials of antipsychotic monotherapy by history:  No  Recommended Plan for Multiple Antipsychotic Therapies: NA    Francely Craw 11/10/2014, 9:38 AM

## 2014-11-14 NOTE — Progress Notes (Signed)
Patient Discharge Instructions:  After Visit Summary (AVS):   Faxed to:  11/14/14 Discharge Summary Note:   Faxed to:  11/14/14 Psychiatric Admission Assessment Note:   Faxed to:  11/14/14 Suicide Risk Assessment - Discharge Assessment:   Faxed to:  11/14/14 Faxed/Sent to the Next Level Care provider:  11/14/14 [facility]ax[facility] to [facility] @ [phone] Faxed to [facility] @ [phone]  Jerelene ReddenSheena E [place], 11/14/2014, 3:24 PM

## 2016-02-24 ENCOUNTER — Ambulatory Visit: Payer: Self-pay | Admitting: Family

## 2016-10-26 ENCOUNTER — Telehealth: Payer: Self-pay | Admitting: Internal Medicine

## 2016-10-26 NOTE — Telephone Encounter (Signed)
Spoke with pt, who is requesting a neb machine, medication & supplies. Pt states she developed a non prod cough, body aches, weakness, increased sob & post nasal drip on Saturday.  Pt denies any fever, chills or sweats. Pt last seen 06/06/14 MW please advise. Thanks.    Patient Instructions   Change to symbicort 80 Take 2 puffs first thing in am and then another 2 puffs about 12 hours later.   Only use your albuterol (proair) as a rescue medication to be used if you can't catch your breath by resting or doing a relaxed purse lip breathing pattern.  - The less you use it, the better it will work when you need it. - Ok to use up to 2 puffs  every 4 hours if you must but call for immediate appointment if use goes up over your usual need - Don't leave home without it !!  (think of it like the spare tire for your car)   If not better > Prednisone 10 mg take  4 each am x 2 days,   2 each am x 2 days,  1 each am x 2 days and stop   Please schedule a follow up visit in 3 months but call sooner if needed

## 2016-10-26 NOTE — Telephone Encounter (Signed)
Pt scheduled for acute visit with MW for 10/27/16 @ 10am. Pt aware and voiced her understanding. Nothing further needed.

## 2016-10-26 NOTE — Telephone Encounter (Signed)
Ok to add on to tomorrow am but can't rx like this s seeing pt, esp  > 1 year since last seen

## 2016-10-27 ENCOUNTER — Ambulatory Visit (INDEPENDENT_AMBULATORY_CARE_PROVIDER_SITE_OTHER): Payer: BLUE CROSS/BLUE SHIELD | Admitting: Internal Medicine

## 2016-10-27 ENCOUNTER — Encounter: Payer: Self-pay | Admitting: Internal Medicine

## 2016-10-27 VITALS — BP 112/70 | HR 65 | Ht 65.0 in | Wt 130.0 lb

## 2016-10-27 DIAGNOSIS — J4540 Moderate persistent asthma, uncomplicated: Secondary | ICD-10-CM | POA: Diagnosis not present

## 2016-10-27 MED ORDER — BUDESONIDE-FORMOTEROL FUMARATE 160-4.5 MCG/ACT IN AERO: 2.0000 | INHALATION_SPRAY | Freq: Two times a day (BID) | RESPIRATORY_TRACT | 0 refills | Status: DC

## 2016-10-27 MED ORDER — PREDNISONE 10 MG PO TABS: ORAL_TABLET | ORAL | 0 refills | Status: DC

## 2016-10-27 NOTE — Patient Instructions (Addendum)
Prednisone 10 mg take  4 each am x 2 days,   2 each am x 2 days,  1 each am x 2 days and stop    Plan A = Automatic = Symbicort 160 Take 2 puffs first thing in am and then another 2 puffs about 12 hours later.   Work on inhaler technique:  relax and gently blow all the way out then take a nice smooth deep breath back in, triggering the inhaler at same time you start breathing in.  Hold for up to 5 seconds if you can. Blow out thru nose. Rinse and gargle with water when done    Plan B = Backup Only use your albuterol (proventil)  as a rescue medication to be used if you can't catch your breath by resting or doing a relaxed purse lip breathing pattern.  - The less you use it, the better it will work when you need it. - Ok to use the inhaler up to 2 puffs  every 4 hours if you must but call for appointment if use goes up over your usual need - Don't leave home without it !!  (think of it like the spare tire for your car)    Please schedule a follow up office visit in  2  weeks, sooner if needed

## 2016-10-27 NOTE — Progress Notes (Signed)
Subjective:    Patient ID: Terri Howard, female    DOB: [birth year]  MRN: [id number]     Brief patient profile:  [age]  yobf never smoker "asthma all her life" always difficult to control and [place] "allergic to everything" on shots until 2008 couldn't tell they helped much but [place] (still  requiring er and hosp while on shots so not clear then helped) referred to pulmonary clinic by Dr Brien Fewti p admit [facility]:  Admit date: 07/30/2012  Discharge date: 08/02/2012    Discharge Diagnoses:  Principal Problem:  *Status asthmaticus  Active Problems:  SOB (shortness of breath)  Hypoxemia  Acute respiratory distress  Acute bronchitis  Hyperglycemia   History of Present Illness  08/04/2012 1st pulmonary eval/ [facility] cc breathing some better but  still on prednisone taper cc  daily symptoms of sob with > slow adls using saba twice daily hfa albertol and used neb 1 am and ventolin around 8 am on day of 1st ov at 10:30am with prominent mostly dry coughing assoc with chest tightness and subjective wheeze as well as nasal congestion and  Sneezing.  Does not recognize specific triggers but some worse with season changes >>stopped Advair , begin Dulera  08/19/2012 Follow up  Returns for 2 weeks follow up for asthma Started on Dulera last ov. Advair discontinued.  Reports breathing is much improved with the dulera, no longer wheezing.   finished the pred taper 6days ago and did not pick up the prilosec or pepcid. No SABA in 2 weeks.  Best she has ever been.     01/25/2013 f/u ov/Wert for asthma Chief Complaint  Patient presents with  . Follow-up    Pt states her breathing is much improved. She states feels dulera is really helping, alot.    out of dulera x 2 weeks and beginning to feel a tight in evening.  Has no saba either hfa or neb. No sob, no cough. Does have some sinus congestion and eyes watering better  with singulair in past but tends to run out of meds when feels better and does not refill them rec Start singulair 10 mg one each evening  Change dulera to 100 Take 2 puffs first thing in am and then another 2 puffs about 12 hours later - if doing great ok to drop to 2 puffs total daily  Only use your albuterol (proaire)    06/06/2014 f/u ov/Wert re: asthma flare off meds  Chief Complaint  Patient presents with  . Acute Visit    Pt c/o wheezing and increased SOB x 6 days. She also c/o non prod cough and nasal congestion. She does not currently have a rescue inhaler and ran out of dulera several months ago.  worse cough/ wheezing at hs since off dulera, thinks it helped more than singulair in retrospect, much worse x 6 days s purulent sputum rec Change to symbicort 80 Take 2 puffs first thing in am and then another 2 puffs about 12 hours later.  Only use your albuterol (proair) as a rescue medication If not better > Prednisone 10 mg take  4 each am x 2 days,   2 each am x 2 days,  1 each am x 2 days and stop     10/27/2016 acute extended ov/Wert re: another asthma flare p ran out of meds  Chief Complaint  Patient presents with  . Acute Visit  Pt c/o wheezing, non prod cough and chest tightness over the past 4 days. She is using albuterol inhaler every hour with minimal relief.   avg use of saba is twice daily gets from [place] from [facility] with nasal congestion   No obvious daytime variabilty or assoc excess/ purulent sputum or mucus plugs    cp or   hb symptoms. No unusual exp hx or h/o childhood pna/  or premature birth to her knowledge.   Sleeping ok without nocturnal  or early am exacerbation  of respiratory  c/o's or need for noct saba. Also denies any obvious fluctuation of symptoms with weather or environmental changes or other aggravating or alleviating factors except as outlined above   Current Medications, Allergies, Past Medical History, Past Surgical History, Family  History, and Social History were reviewed in [employer] record.  ROS  The following are not active complaints unless bolded sore throat, dysphagia, dental problems, itching, sneezing,  nasal congestion or excess/ purulent secretions, ear ache,   fever, chills, sweats, unintended wt loss, pleuritic or exertional cp, hemoptysis,  orthopnea pnd or leg swelling, presyncope, palpitations, heartburn, abdominal pain, anorexia, nausea, vomiting, diarrhea  or change in bowel or urinary habits, change in stools or urine, dysuria,hematuria,  rash, arthralgias, visual complaints, headache, numbness weakness or ataxia or problems with walking or coordination,  change in mood/affect or memory.         Objective:   Physical Exam   amb thin bf nad   Wt 121 08/05/2012 >123 08/19/2012 > 130 01/25/13 > 06/06/2014 143 > 10/27/2016   130    Vital signs reviewed  - Note on arrival 02 sats  97% on RA   HEENT: nl dentition, and oropharynx. Nl external ear canals without cough reflex - moderate bilateral non-specific turbinate edema     NECK :  without JVD/Nodes/TM/ nl carotid upstrokes bilaterally   LUNGS: no acc muscle use,  Nl contour chest with minimal exp wheeze bilaterally   CV:  RRR  no s3 or murmur or increase in P2, nad no edema   ABD:  soft and nontender with nl inspiratory excursion in the supine position. No bruits or organomegaly appreciated, bowel sounds nl  MS:  Nl gait/ ext warm without deformities, calf tenderness, cyanosis or clubbing No obvious joint restrictions   SKIN: warm and dry without lesions    NEURO:  alert, approp, nl sensorium with  no motor or cerebellar deficits apparent.                   Assessment & Plan:

## 2016-11-01 ENCOUNTER — Encounter: Payer: Self-pay | Admitting: Internal Medicine

## 2016-11-01 NOTE — Assessment & Plan Note (Signed)
-   11/01/2016 p extensive coaching HFA effectiveness =    90%   Poor control of symptoms In this case Adherence is the biggest issue and starts with  inability to use HFA effectively and also  understand that SABA treats the symptoms but doesn't get to the underlying problem (inflammation).  I used  the analogy of putting steroid cream on a rash to help explain the meaning of topical therapy and the need to get the drug to the target tissue.    I had an extended discussion with the patient reviewing all relevant studies completed to date and  lasting 15 to 20 minutes of a 25 minute visit    Each maintenance medication was reviewed in detail including most importantly the difference between maintenance and prns and under what circumstances the prns are to be triggered using an action plan format that is not reflected in the computer generated alphabetically organized AVS.    Please see AVS for specific instructions unique to this visit that I personally wrote and verbalized to the the pt in detail and then reviewed with pt  by my nurse highlighting any  changes in therapy recommended at today's visit to their plan of care.

## 2016-11-10 ENCOUNTER — Ambulatory Visit: Payer: Self-pay | Admitting: Internal Medicine

## 2018-01-06 ENCOUNTER — Other Ambulatory Visit: Payer: Self-pay

## 2018-01-06 ENCOUNTER — Emergency Department (HOSPITAL_BASED_OUTPATIENT_CLINIC_OR_DEPARTMENT_OTHER)
Admission: EM | Admit: 2018-01-06 | Discharge: 2018-01-06 | Disposition: A | Discharge status: Home / Self Care | Payer: BLUE CROSS/BLUE SHIELD | Attending: Emergency Medicine | Admitting: Emergency Medicine

## 2018-01-06 ENCOUNTER — Encounter (HOSPITAL_BASED_OUTPATIENT_CLINIC_OR_DEPARTMENT_OTHER): Payer: Self-pay | Admitting: *Deleted

## 2018-01-06 DIAGNOSIS — N3001 Acute cystitis with hematuria: Secondary | ICD-10-CM | POA: Insufficient documentation

## 2018-01-06 DIAGNOSIS — Z79899 Other long term (current) drug therapy: Secondary | ICD-10-CM | POA: Insufficient documentation

## 2018-01-06 DIAGNOSIS — J45909 Unspecified asthma, uncomplicated: Secondary | ICD-10-CM | POA: Insufficient documentation

## 2018-01-06 LAB — URINALYSIS, ROUTINE W REFLEX MICROSCOPIC
Bilirubin Urine: NEGATIVE
GLUCOSE, UA: NEGATIVE mg/dL
Ketones, ur: 15 mg/dL — AB
Nitrite: NEGATIVE
PH: 7 (ref 5.0–8.0)
Protein, ur: 100 mg/dL — AB
SPECIFIC GRAVITY, URINE: 1.02 (ref 1.005–1.030)

## 2018-01-06 LAB — URINALYSIS, MICROSCOPIC (REFLEX)

## 2018-01-06 LAB — PREGNANCY, URINE: PREG TEST UR: NEGATIVE

## 2018-01-06 MED ORDER — CEPHALEXIN 500 MG PO CAPS: 500.0000 mg | ORAL_CAPSULE | Freq: Two times a day (BID) | ORAL | 0 refills | Status: AC

## 2018-01-06 MED ORDER — FLUCONAZOLE 150 MG PO TABS: 150.0000 mg | ORAL_TABLET | Freq: Every day | ORAL | 0 refills | Status: AC

## 2018-01-06 MED FILL — CEPHALEXIN 500 MG CAPSULE: 500 | 7 days supply | Qty: 14 | Fill #0

## 2018-01-06 MED FILL — FLUCONAZOLE 150 MG TABS: 150 | 3 days supply | Qty: 3 | Fill #0

## 2018-01-06 NOTE — ED Triage Notes (Signed)
Hematuria x2 days

## 2018-01-06 NOTE — Discharge Instructions (Addendum)
Please begin taking Keflex (antibiotic) one tablet every 12 hours for the next 7 days.   If you develop vaginal discharge or irritation (symptoms of a yeast infection), you can begin taking diflucan, one tablet daily.   If your symptoms do not improve, if you develop fevers or vomiting, or if your back and/or abdominal pain worsens, please return to the emergency room.

## 2018-01-06 NOTE — ED Provider Notes (Signed)
[facility] EMERGENCY DEPARTMENT Provider Note   CSN: [id number] Arrival date & time: 01/06/18  1656  History   Chief Complaint Chief Complaint  Patient presents with  . Hematuria    HPI Terri Howard is a 27 y.o. female presenting with hematuria.    HPI  Patient presents with hematuria x2d. Says that urine looks dark and has also noticed pink streaks on toilet paper when wiping. Also reports dysuria, including abd pain towards end of micturition. Also endorses bilateral low back pain. Has history of UTIs and says current sx feel similar to these. Denies N/V, fevers. NL appetite. Has not taken anything to help with sx. Denies vaginal discharge.   Past Medical History:  Diagnosis Date  . Asthma   . Chlamydia   . GBS carrier 08/23/2011  . Urinary tract infection, recurrent     Patient Active Problem List   Diagnosis Date Noted  . Suicide attempt by drug ingestion (HCC)   . Depression 11/07/2014  . Hypokalemia 11/06/2014  . Overdose 11/05/2014  . TCA (tricyclic antidepressant) overdose of undetermined intent 11/05/2014  . Hyperglycemia 07/30/2012  . chronic asthma  08/23/2011    Past Surgical History:  Procedure Laterality Date  . BREAST LUMPECTOMY  2000   fibroadenma     OB History    Gravida  1   Para  1   Term  1   Preterm      AB      Living  1     SAB      TAB      Ectopic      Multiple      Live Births  1            Home Medications    Prior to Admission medications   Medication Sig Start Date End Date Taking? Authorizing Provider  albuterol (PROAIR HFA) 108 (90 BASE) MCG/ACT inhaler 2 puffs every 4 hours as needed for shortness of breath, wheezing and cough (asthma) 11/10/14  Yes Rankin, Shuvon B, NP  LITHIUM PO Take by mouth.   Yes [provider]  traZODone (DESYREL) 50 MG tablet Take 1 tablet (50 mg total) by mouth at bedtime as needed for sleep. 11/10/14  Yes Rankin, Shuvon B, NP  budesonide-formoterol  (SYMBICORT) 160-4.5 MCG/ACT inhaler Inhale 2 puffs into the lungs 2 (two) times daily. 10/27/16   Nyoka Cowden, MD  cephALEXin (KEFLEX) 500 MG capsule Take 1 capsule (500 mg total) by mouth 2 (two) times daily for 7 days. 01/06/18 01/13/18  Marquette Saa, MD  fluconazole (DIFLUCAN) 150 MG tablet Take 1 tablet (150 mg total) by mouth daily for 3 days. 01/06/18 01/09/18  Marquette Saa, MD  lamoTRIgine (LAMICTAL) 25 MG tablet Take 1 tablet (25 mg total) by mouth daily. For mood stabilization 11/10/14   Rankin, Shuvon B, NP    Family History Family History  Problem Relation Age of Onset  . Ovarian cancer Mother   . Heart disease Mother   . Hypertension Mother   . Mental illness Mother   . Hypertension Father   . Hypertension Maternal Aunt   . Hypertension Maternal Uncle   . Hypertension Maternal Grandmother   . Kidney disease Maternal Grandmother   . COPD Maternal Grandmother   . Breast cancer Maternal Grandmother   . Hypertension Maternal Grandfather     Social History Social History   Tobacco Use  . Smoking status: Never Smoker  . Smokeless tobacco: Never Used  Substance Use Topics  . Alcohol use: Yes    Comment: occasional  . Drug use: No     Allergies   Patient has no known allergies.   Review of Systems Review of Systems  Constitutional: Negative for activity change, appetite change and fever.  Respiratory: Negative for shortness of breath.   Cardiovascular: Negative for chest pain.  Gastrointestinal: Positive for abdominal pain. Negative for nausea and vomiting.  Genitourinary: Positive for dysuria and hematuria.  Musculoskeletal: Positive for back pain (Bilateral lumbar).   Physical Exam Updated Vital Signs BP 111/68   Pulse 64   Temp 98.6 F (37 C) (Oral)   Resp 18   Ht 5\' 5"  (1.651 m)   Wt 61.2 kg (135 lb)   LMP 12/30/2017   SpO2 98%   BMI 22.47 kg/m   Physical Exam  Constitutional: She is oriented to person, place, and time. She  appears well-developed and well-nourished. No distress.  HENT:  Head: Normocephalic and atraumatic.  Nose: Nose normal.  Mouth/Throat: Oropharynx is clear and moist. No oropharyngeal exudate.  Eyes: Pupils are equal, round, and reactive to light. Conjunctivae and EOM are normal. Right eye exhibits no discharge. Left eye exhibits no discharge.  Neck: Normal range of motion. Neck supple.  Cardiovascular: Normal rate, regular rhythm and normal heart sounds.  No murmur heard. Pulmonary/Chest: Effort normal and breath sounds normal. No respiratory distress. She has no wheezes.  Abdominal: Soft. Bowel sounds are normal. She exhibits no distension. There is no tenderness.  Musculoskeletal: Normal range of motion. She exhibits no edema.  No CVA tenderness  Lymphadenopathy:    She has no cervical adenopathy.  Neurological: She is alert and oriented to person, place, and time. No cranial nerve deficit.  Skin: Skin is warm and dry.  Psychiatric: She has a normal mood and affect. Her behavior is normal.   ED Treatments / Results  Labs (all labs ordered are listed, but only abnormal results are displayed) Labs Reviewed  URINALYSIS, ROUTINE W REFLEX MICROSCOPIC - Abnormal; Notable for the following components:      Result Value   APPearance CLOUDY (*)    Hgb urine dipstick LARGE (*)    Ketones, ur 15 (*)    Protein, ur 100 (*)    Leukocytes, UA SMALL (*)    All other components within normal limits  URINALYSIS, MICROSCOPIC (REFLEX) - Abnormal; Notable for the following components:   Bacteria, UA FEW (*)    Squamous Epithelial / LPF 0-5 (*)    All other components within normal limits  URINE CULTURE  PREGNANCY, URINE    EKG None  Radiology No results found.  Procedures Procedures (including critical care time)  Medications Ordered in ED Medications - No data to display   Initial Impression / Assessment and Plan / ED Course  I have reviewed the triage vital signs and the nursing  notes.  Pertinent labs & imaging results that were available during my care of the patient were reviewed by me and considered in my medical decision making (see chart for details).     1722 Patient presenting with hematuria, dysuria, lower back pain. Same sx as she has had when diagnosed with UTI in the past. No CVA tenderness or abd TTP on exam. UA with small leuks, large Hgb, few bacteria. Upreg neg. UA not entirely consistent with UTI, as mostly Hgb, however given sx and hx of UTI w/similar sx, will treat as UTI with Keflex 500mg  BID x7d. Will also prescribe  diflucan as patient reports hx yeast infections with abx. Large amount of Hgb on UA could also be secondary to nephrolithiasis, however would expect more significant pain, which patient is not reporting. No vaginal discharge to suggest pelvic infection either.      Final Clinical Impressions(s) / ED Diagnoses   Final diagnoses:  Acute cystitis with hematuria    ED Discharge Orders        Ordered    cephALEXin (KEFLEX) 500 MG capsule  2 times daily     01/06/18 1725    fluconazole (DIFLUCAN) 150 MG tablet  Daily     01/06/18 1738     Tarri Abernethy, MD, MPH PGY-3 [facility] Pager [phone]    Marquette Saa, MD 01/06/18 1743    Gwyneth Sprout, MD 01/06/18 [id number]

## 2018-01-07 LAB — URINE CULTURE

## 2018-12-19 ENCOUNTER — Encounter (HOSPITAL_COMMUNITY): Payer: Self-pay

## 2018-12-19 ENCOUNTER — Ambulatory Visit (HOSPITAL_COMMUNITY)
Admission: EM | Admit: 2018-12-19 | Discharge: 2018-12-19 | Disposition: A | Discharge status: Home / Self Care | Payer: No Typology Code available for payment source | Attending: Family Medicine | Admitting: Family Medicine

## 2018-12-19 ENCOUNTER — Other Ambulatory Visit: Payer: Self-pay

## 2018-12-19 DIAGNOSIS — N390 Urinary tract infection, site not specified: Secondary | ICD-10-CM | POA: Diagnosis not present

## 2018-12-19 LAB — POCT URINALYSIS DIP (DEVICE)
BILIRUBIN URINE: NEGATIVE
Glucose, UA: NEGATIVE mg/dL
KETONES UR: 15 mg/dL — AB
Nitrite: NEGATIVE
Protein, ur: NEGATIVE mg/dL
SPECIFIC GRAVITY, URINE: 1.015 (ref 1.005–1.030)
UROBILINOGEN UA: 0.2 mg/dL (ref 0.0–1.0)
pH: 7 (ref 5.0–8.0)

## 2018-12-19 LAB — POCT PREGNANCY, URINE: Preg Test, Ur: NEGATIVE

## 2018-12-19 MED ORDER — CEPHALEXIN 500 MG PO CAPS: 500.0000 mg | ORAL_CAPSULE | Freq: Two times a day (BID) | ORAL | 0 refills | Status: AC

## 2018-12-19 NOTE — Discharge Instructions (Addendum)
Negative pregnancy today.  Drink plenty of water to empty bladder regularly. Avoid alcohol and caffeine as these may irritate the bladder.   Complete course of antibiotics.   If symptoms worsen or do not improve in the next week to return to be seen or to follow up with your PCP.

## 2018-12-19 NOTE — ED Provider Notes (Signed)
[facility]    CSN: [id number] Arrival date & time: 12/19/18  1946     History   Chief Complaint Chief Complaint  Patient presents with  . Urinary Tract Infection    HPI Terri Howard is a 28 y.o. female.   Terri Howard presents with complaints of frequent and urgent urination, burning with urination. Started 2-3 days ago. Similar to UTI's she has had in the past. No fever. No pelvic pain. No back pain. No vaginal symptoms. States she would like a pregnancy test. LMP 2/19.     ROS per HPI, negative if not otherwise mentioned.      Past Medical History:  Diagnosis Date  . Asthma   . Chlamydia   . GBS carrier 08/23/2011  . Urinary tract infection, recurrent     Patient Active Problem List   Diagnosis Date Noted  . Suicide attempt by drug ingestion (HCC)   . Depression 11/07/2014  . Hypokalemia 11/06/2014  . Overdose 11/05/2014  . TCA (tricyclic antidepressant) overdose of undetermined intent 11/05/2014  . Hyperglycemia 07/30/2012  . chronic asthma  08/23/2011    Past Surgical History:  Procedure Laterality Date  . BREAST LUMPECTOMY  2000   fibroadenma    OB History    Gravida  1   Para  1   Term  1   Preterm      AB      Living  1     SAB      TAB      Ectopic      Multiple      Live Births  1            Home Medications    Prior to Admission medications   Medication Sig Start Date End Date Taking? Authorizing Provider  albuterol (PROAIR HFA) 108 (90 BASE) MCG/ACT inhaler 2 puffs every 4 hours as needed for shortness of breath, wheezing and cough (asthma) 11/10/14  Yes Rankin, Shuvon B, NP  budesonide-formoterol (SYMBICORT) 160-4.5 MCG/ACT inhaler Inhale 2 puffs into the lungs 2 (two) times daily. 10/27/16   Nyoka CowdenWert, Michael B, MD  cephALEXin (KEFLEX) 500 MG capsule Take 1 capsule (500 mg total) by mouth 2 (two) times daily for 7 days. 12/19/18 12/26/18  Georgetta HaberBurky, Natalie B, NP  lamoTRIgine (LAMICTAL) 25 MG tablet Take 1 tablet (25  mg total) by mouth daily. For mood stabilization 11/10/14   Rankin, Shuvon B, NP  LITHIUM PO Take by mouth.    [provider]  traZODone (DESYREL) 50 MG tablet Take 1 tablet (50 mg total) by mouth at bedtime as needed for sleep. 11/10/14   Rankin, Rada HayShuvon B, NP    Family History Family History  Problem Relation Age of Onset  . Ovarian cancer Mother   . Heart disease Mother   . Hypertension Mother   . Mental illness Mother   . Hypertension Father   . Hypertension Maternal Aunt   . Hypertension Maternal Uncle   . Hypertension Maternal Grandmother   . Kidney disease Maternal Grandmother   . COPD Maternal Grandmother   . Breast cancer Maternal Grandmother   . Hypertension Maternal Grandfather     Social History Social History   Tobacco Use  . Smoking status: Never Smoker  . Smokeless tobacco: Never Used  Substance Use Topics  . Alcohol use: Yes    Comment: occasional  . Drug use: No     Allergies   Patient has no known allergies.   Review of Systems  Review of Systems   Physical Exam Triage Vital Signs ED Triage Vitals  Enc Vitals Group     BP 12/19/18 2002 111/64     Pulse Rate 12/19/18 2002 60     Resp 12/19/18 2002 16     Temp 12/19/18 2002 98.6 F (37 C)     Temp Source 12/19/18 2002 Oral     SpO2 12/19/18 2002 100 %     Weight --      Height --      Head Circumference --      Peak Flow --      Pain Score 12/19/18 2003 5     Pain Loc --      Pain Edu? --      Excl. in GC? --    No data found.  Updated Vital Signs BP 111/64 (BP Location: Left Arm)   Pulse 60   Temp 98.6 F (37 C) (Oral)   Resp 16   LMP 11/23/2018 (Exact Date)   SpO2 100%   Physical Exam Constitutional:      General: She is not in acute distress.    Appearance: She is well-developed.  Cardiovascular:     Rate and Rhythm: Normal rate and regular rhythm.  Pulmonary:     Effort: Pulmonary effort is normal.  Abdominal:     Tenderness: There is no abdominal tenderness.  There is no right CVA tenderness or left CVA tenderness.  Skin:    General: Skin is warm and dry.  Neurological:     Mental Status: She is alert and oriented to person, place, and time.      UC Treatments / Results  Labs (all labs ordered are listed, but only abnormal results are displayed) Labs Reviewed  POCT URINALYSIS DIP (DEVICE) - Abnormal; Notable for the following components:      Result Value   Ketones, ur 15 (*)    Hgb urine dipstick MODERATE (*)    Leukocytes,Ua LARGE (*)    All other components within normal limits  URINE CULTURE  POC URINE PREG, ED    EKG None  Radiology No results found.  Procedures Procedures (including critical care time)  Medications Ordered in UC Medications - No data to display  Initial Impression / Assessment and Plan / UC Course  I have reviewed the triage vital signs and the nursing notes.  Pertinent labs & imaging results that were available during my care of the patient were reviewed by me and considered in my medical decision making (see chart for details).     Urine consistent with UTI, culture pending as patient endorses frequent UTI's. Keflex initiated. If symptoms worsen or do not improve in the next week to return to be seen or to follow up with PCP.  Patient verbalized understanding and agreeable to plan.    Final Clinical Impressions(s) / UC Diagnoses   Final diagnoses:  Lower urinary tract infectious disease     Discharge Instructions     Drink plenty of water to empty bladder regularly. Avoid alcohol and caffeine as these may irritate the bladder.   Complete course of antibiotics.   If symptoms worsen or do not improve in the next week to return to be seen or to follow up with your PCP.     ED Prescriptions    Medication Sig Dispense Auth. Provider   cephALEXin (KEFLEX) 500 MG capsule Take 1 capsule (500 mg total) by mouth 2 (two) times daily for 7 days. 14 capsule  Georgetta Haber, NP     Controlled  Substance Prescriptions [place] Controlled Substance Registry consulted? Not Applicable   Georgetta Haber, NP 12/19/18 2049

## 2018-12-19 NOTE — ED Triage Notes (Signed)
Pt presents to [facility] for possible UTI x2 days, pt complains of frequent urination and burning. Pt has taken OTC medications but has no relief

## 2018-12-21 LAB — URINE CULTURE

## 2018-12-22 ENCOUNTER — Telehealth (HOSPITAL_COMMUNITY): Payer: Self-pay | Admitting: Emergency Medicine

## 2018-12-22 MED ORDER — SULFAMETHOXAZOLE-TRIMETHOPRIM 800-160 MG PO TABS: 1.0000 | ORAL_TABLET | Freq: Two times a day (BID) | ORAL | 0 refills | Status: AC

## 2018-12-22 MED ORDER — FLUCONAZOLE 150 MG PO TABS: 150.0000 mg | ORAL_TABLET | Freq: Once | ORAL | 0 refills | Status: AC

## 2018-12-22 NOTE — Telephone Encounter (Signed)
Urine culture was positive for STAPHYLOCOCCUS SAPROPHYTICUSA resistant to keflex  given at urgent care visit. Prescription for bactrim per sent to pharmacy of choice. Pt called and made aware. Pt educated to follow up if symptoms are not improving. Verbalized understanding.   Pt states she is feeling somewhat better but is concerned about reoccurrence. Will swap to bactrim, pt also concerned about yeast infection, will send medicine for that as well. All questions answered.

## 2019-01-02 ENCOUNTER — Ambulatory Visit: Payer: Self-pay | Admitting: Family Medicine

## 2019-01-02 NOTE — Progress Notes (Deleted)
Patient: Terri Howard MRN: [id number] DOB: 1991/06/11 PCP: Patient, No Pcp Per     Subjective:  No chief complaint on file.   HPI: The patient is a 28 y.o. female who presents today for annual exam. {He/she (caps):30048} denies any changes to past medical history. There have been no recent hospitalizations. They {Actions; are/are not:16769} following a well balanced diet and exercise plan. Weight has been {trend:16658}. No complaints today.   Immunization History  Administered Date(s) Administered  . Influenza Split 07/31/2012  . Tdap 08/24/2011    Pap smear:    Review of Systems  Allergies Patient has No Known Allergies.  Past Medical History Patient  has a past medical history of Asthma, Chlamydia, GBS carrier (08/23/2011), and Urinary tract infection, recurrent.  Surgical History Patient  has a past surgical history that includes Breast lumpectomy (2000).  Family History Pateint's family history includes Breast cancer in her maternal grandmother; COPD in her maternal grandmother; Heart disease in her mother; Hypertension in her father, maternal aunt, maternal grandfather, maternal grandmother, maternal uncle, and mother; Kidney disease in her maternal grandmother; Mental illness in her mother; Ovarian cancer in her mother.  Social History Patient  reports that she has never smoked. She has never used smokeless tobacco. She reports current alcohol use. She reports that she does not use drugs.    Objective: There were no vitals filed for this visit.  There is no height or weight on file to calculate BMI.  Physical Exam     Assessment/plan:    No follow-ups on file.     Orland Mustard, MD [facility]  01/02/2019

## 2019-01-06 ENCOUNTER — Ambulatory Visit: Payer: Self-pay | Admitting: Family Medicine

## 2019-01-09 ENCOUNTER — Other Ambulatory Visit: Payer: Self-pay

## 2019-01-09 ENCOUNTER — Ambulatory Visit (INDEPENDENT_AMBULATORY_CARE_PROVIDER_SITE_OTHER): Payer: No Typology Code available for payment source | Admitting: Family Medicine

## 2019-01-09 ENCOUNTER — Other Ambulatory Visit (HOSPITAL_COMMUNITY)
Admission: RE | Admit: 2019-01-09 | Discharge: 2019-01-09 | Disposition: A | Discharge status: Home / Self Care | Payer: No Typology Code available for payment source | Source: Ambulatory Visit | Attending: Family Medicine | Admitting: Family Medicine

## 2019-01-09 ENCOUNTER — Encounter: Payer: Self-pay | Admitting: Family Medicine

## 2019-01-09 VITALS — BP 92/70 | HR 60 | Temp 97.5°F | Ht 65.0 in | Wt 122.4 lb

## 2019-01-09 DIAGNOSIS — Z0000 Encounter for general adult medical examination without abnormal findings: Secondary | ICD-10-CM

## 2019-01-09 DIAGNOSIS — Z114 Encounter for screening for human immunodeficiency virus [HIV]: Secondary | ICD-10-CM

## 2019-01-09 DIAGNOSIS — N390 Urinary tract infection, site not specified: Secondary | ICD-10-CM | POA: Insufficient documentation

## 2019-01-09 DIAGNOSIS — J4540 Moderate persistent asthma, uncomplicated: Secondary | ICD-10-CM

## 2019-01-09 DIAGNOSIS — F3181 Bipolar II disorder: Secondary | ICD-10-CM | POA: Diagnosis not present

## 2019-01-09 DIAGNOSIS — Z113 Encounter for screening for infections with a predominantly sexual mode of transmission: Secondary | ICD-10-CM

## 2019-01-09 DIAGNOSIS — Z0001 Encounter for general adult medical examination with abnormal findings: Secondary | ICD-10-CM

## 2019-01-09 LAB — LIPID PANEL
Cholesterol: 159 mg/dL (ref 0–200)
HDL: 49.5 mg/dL (ref >39.00)
LDL Cholesterol: 100 mg/dL — ABNORMAL HIGH (ref 0–99)
NonHDL: 109.86
Total CHOL/HDL Ratio: 3
Triglycerides: 49 mg/dL (ref 0.0–149.0)
VLDL: 9.8 mg/dL (ref 0.0–40.0)

## 2019-01-09 LAB — COMPREHENSIVE METABOLIC PANEL
ALT: 14 U/L (ref 0–35)
AST: 16 U/L (ref 0–37)
Albumin: 4.2 g/dL (ref 3.5–5.2)
Alkaline Phosphatase: 55 U/L (ref 39–117)
BUN: 12 mg/dL (ref 6–23)
CO2: 26 mEq/L (ref 19–32)
Calcium: 8.6 mg/dL (ref 8.4–10.5)
Chloride: 105 mEq/L (ref 96–112)
Creatinine, Ser: 0.84 mg/dL (ref 0.40–1.20)
GFR: 96.55 mL/min (ref >60.00)
Glucose, Bld: 81 mg/dL (ref 70–99)
Potassium: 3.7 mEq/L (ref 3.5–5.1)
Sodium: 138 mEq/L (ref 135–145)
Total Bilirubin: 0.5 mg/dL (ref 0.2–1.2)
Total Protein: 6.6 g/dL (ref 6.0–8.3)

## 2019-01-09 LAB — CBC WITH DIFFERENTIAL/PLATELET
Basophils Absolute: 0 10*3/uL (ref 0.0–0.1)
Basophils Relative: 0.8 % (ref 0.0–3.0)
Eosinophils Absolute: 0.4 10*3/uL (ref 0.0–0.7)
Eosinophils Relative: 9.5 % — ABNORMAL HIGH (ref 0.0–5.0)
HCT: 42.7 % (ref 36.0–46.0)
Hemoglobin: 14.2 g/dL (ref 12.0–15.0)
Lymphocytes Relative: 43.4 % (ref 12.0–46.0)
Lymphs Abs: 1.9 10*3/uL (ref 0.7–4.0)
MCHC: 33.2 g/dL (ref 30.0–36.0)
MCV: 92.1 fl (ref 78.0–100.0)
Monocytes Absolute: 0.3 10*3/uL (ref 0.1–1.0)
Monocytes Relative: 7.7 % (ref 3.0–12.0)
Neutro Abs: 1.7 10*3/uL (ref 1.4–7.7)
Neutrophils Relative %: 38.6 % — ABNORMAL LOW (ref 43.0–77.0)
Platelets: 193 10*3/uL (ref 150.0–400.0)
RBC: 4.64 Mil/uL (ref 3.87–5.11)
RDW: 13.9 % (ref 11.5–15.5)
WBC: 4.4 10*3/uL (ref 4.0–10.5)

## 2019-01-09 LAB — TSH: TSH: 0.61 u[IU]/mL (ref 0.35–4.50)

## 2019-01-09 MED ORDER — BUDESONIDE-FORMOTEROL FUMARATE 160-4.5 MCG/ACT IN AERO: 2.0000 | INHALATION_SPRAY | Freq: Two times a day (BID) | RESPIRATORY_TRACT | 3 refills | Status: DC

## 2019-01-09 MED ORDER — ALBUTEROL SULFATE HFA 108 (90 BASE) MCG/ACT IN AERS: 1.0000 | INHALATION_SPRAY | Freq: Four times a day (QID) | RESPIRATORY_TRACT | 3 refills | Status: DC | PRN

## 2019-01-09 NOTE — Patient Instructions (Signed)
-  referred you to ob/gyn as you requested -[facility] placed for [facility], but make appointment.  -labs today -refilled inhalers.

## 2019-01-09 NOTE — Progress Notes (Signed)
Patient: Terri Howard MRN: [id number] DOB: 1990/12/27 PCP: Orland Mustard, MD     Subjective:  Chief Complaint  Patient presents with  . Establish Care    HPI: The patient is a 28 y.o. female who presents today for annual exam. She denies any changes to past medical history. There have been no recent hospitalizations. They are following a well balanced diet and exercise plan. Weight has been stable. No complaints today.   She has past medical hx of bipolar 2 disorder and suicide intent in 2016. She also has asthma that required maintenance therapy with symbicort. She has not had her inhalers in awhile. Needing rescue inhaler up to 3x/day.   [facility]. Has been off medication for quite some time.   Bipolar 2: was getting treated [facility]. Has not been on medication in a while, but feels like she is doing really well. Is still a patient at this place, but has not had insurance to continue care. Now that she has insurance, she would like to go back.   Immunization History  Administered Date(s) Administered  . Influenza Split 07/31/2012  . Tdap 08/24/2011    Pap smear: overdue  Tdap: 2012 Hiv: today GC/C: today   Review of Systems  Constitutional: Positive for unexpected weight change. Negative for chills, fatigue and fever.       Pt states that she has lost almost 10 lbs within a couple of months time  HENT: Negative for dental problem, ear pain, hearing loss and trouble swallowing.   Eyes: Negative for visual disturbance.  Respiratory: Positive for shortness of breath. Negative for cough and chest tightness.        Pt is asthmatic  Cardiovascular: Negative for chest pain, palpitations and leg swelling.  Gastrointestinal: Negative for abdominal pain, blood in stool, diarrhea and nausea.  Endocrine: Negative for cold intolerance, polydipsia, polyphagia and polyuria.  Genitourinary: Negative for dysuria and hematuria.  Musculoskeletal: Negative  for arthralgias, back pain and neck pain.  Skin: Negative.  Negative for rash.  Neurological: Negative for dizziness and headaches.  Psychiatric/Behavioral: Negative for dysphoric mood and sleep disturbance. The patient is not nervous/anxious.     Allergies Patient has No Known Allergies.  Past Medical History Patient  has a past medical history of Asthma, Chlamydia, GBS carrier (08/23/2011), Screening for HIV (human immunodeficiency virus), and Urinary tract infection, recurrent.  Surgical History Patient  has a past surgical history that includes Breast lumpectomy (2000).  Family History Pateint's family history includes Alcohol abuse in her father and mother; Birth defects in her maternal grandmother; Breast cancer in her maternal grandmother; COPD in her maternal grandmother; Depression in her mother; Drug abuse in her father and mother; Heart attack in her mother; Heart disease in her mother; Hyperlipidemia in her mother; Hypertension in her father, maternal aunt, maternal grandfather, maternal grandmother, maternal uncle, and mother; Kidney disease in her maternal grandmother; Mental illness in her mother; Ovarian cancer in her mother.  Social History Patient  reports that she has never smoked. She has never used smokeless tobacco. She reports current alcohol use. She reports current drug use. Drug: Marijuana.    Objective: Vitals:   01/09/19 1054  BP: 92/70  Pulse: 60  Temp: (!) 97.5 F (36.4 C)  TempSrc: Oral  SpO2: 97%  Weight: 122 lb 6.4 oz (55.5 kg)  Height:  (1.651 m)    Body mass index is 20.37 kg/m.  Physical Exam Vitals signs reviewed.  Constitutional:  Appearance: She is well-developed.  HENT:     Right Ear: External ear normal.     Left Ear: External ear normal.  Eyes:     Conjunctiva/sclera: Conjunctivae normal.     Pupils: Pupils are equal, round, and reactive to light.  Neck:     Musculoskeletal: Normal range of motion and neck supple.      Thyroid: No thyromegaly.  Cardiovascular:     Rate and Rhythm: Normal rate and regular rhythm.     Heart sounds: Normal heart sounds. No murmur.  Pulmonary:     Effort: Pulmonary effort is normal.     Breath sounds: Normal breath sounds.  Abdominal:     General: Bowel sounds are normal. There is no distension.     Palpations: Abdomen is soft.     Tenderness: There is no abdominal tenderness.  Lymphadenopathy:     Cervical: No cervical adenopathy.  Skin:    General: Skin is warm and dry.     Findings: No rash.  Neurological:     Mental Status: She is alert and oriented to person, place, and time.     Cranial Nerves: No cranial nerve deficit.     Coordination: Coordination normal.     Deep Tendon Reflexes: Reflexes normal.  Psychiatric:        Behavior: Behavior normal.    GAD 7 : Generalized Anxiety Score 01/09/2019  Nervous, Anxious, on Edge 1  Control/stop worrying 1  Worry too much - different things 1  Trouble relaxing 1  Restless 2  Easily annoyed or irritable 0  Afraid - awful might happen 0  Total GAD 7 Score 6  Anxiety Difficulty Not difficult at all    Depression screen [facility] 2/[facility] 01/09/2019  Decreased Interest 0  Down, Depressed, Hopeless 0  PHQ - 2 Score 0  Altered sleeping 0  Tired, decreased energy 1  Change in appetite 1  Feeling bad or failure about yourself  0  Trouble concentrating 2  Moving slowly or fidgety/restless 0  Suicidal thoughts 0  PHQ-9 Score 4  Difficult doing work/chores Not difficult at all       Assessment/plan: 1. Annual physical exam Routine lab work today. STD screen and updating HM. Overdue for pap smear, but already will be seeing gyn, so will let her f/u with them for this. Safe sex precautions. F/u in one year or as needed.  Patient counseling [x]    Nutrition: Stressed importance of moderation in sodium/caffeine intake, saturated fat and cholesterol, caloric balance, sufficient intake of fresh fruits, vegetables, fiber, calcium,  iron, and 1 mg of folate supplement per day (for females capable of pregnancy).  [x]    Stressed the importance of regular exercise.   [x]    Substance Abuse: Discussed cessation/primary prevention of tobacco, alcohol, or other drug use; driving or other dangerous activities under the influence; availability of treatment for abuse.   [x]    Injury prevention: Discussed safety belts, safety helmets, smoke detector, smoking near bedding or upholstery.   [x]    Sexuality: Discussed sexually transmitted diseases, partner selection, use of condoms, avoidance of unintended pregnancy  and contraceptive alternatives.  [x]    Dental health: Discussed importance of regular tooth brushing, flossing, and dental visits.  [x]    Health maintenance and immunizations reviewed. Please refer to Health maintenance section.    - Comprehensive metabolic panel - CBC with Differential/Platelet - TSH - Lipid panel - Ambulatory referral to Obstetrics / Gynecology  2. Encounter for screening for HIV  -  HIV Antibody (routine testing w rflx)  3. Screen for STD (sexually transmitted disease) Recommended condom use. She is interested in birth control, but at this point I don't want to start ocp and her bi polar drugs nor just ocp if it could throw off her mood. She is interested in nexplanon which will be done with gyn. Referral already placed for this.  - Urine cytology ancillary only([facility])  4. Bipolar 2 disorder (HCC) She feels like she is doing okay, but I feel like she needs medication. Recently started dating someone and is sexually active and stated she wouldn't mind getting pregnant even though she has hardly known him. I find this to be risky and poor judgement. Encouraged her to f/u with her provider.  - Ambulatory referral to Psychiatry  5. chronic asthma  Discussed if she is using the rescue inhaler 3x/day we need to add back a maintenance inhaler. Saw pulm and was previously on symbicort so will start  this back and use rescue inhaler only as needed. If she is still needing multiple times a day she needs to let me know.     Return if symptoms worsen or fail to improve.     Orland Mustard, MD [facility]  01/11/2019

## 2019-01-10 LAB — URINE CYTOLOGY ANCILLARY ONLY
Chlamydia: NEGATIVE
Neisseria Gonorrhea: POSITIVE — AB
Trichomonas: NEGATIVE

## 2019-01-10 LAB — HIV ANTIBODY (ROUTINE TESTING W REFLEX): HIV 1&2 Ab, 4th Generation: NONREACTIVE

## 2019-01-11 ENCOUNTER — Telehealth: Payer: Self-pay

## 2019-01-11 ENCOUNTER — Other Ambulatory Visit: Payer: Self-pay

## 2019-01-11 ENCOUNTER — Telehealth: Payer: Self-pay | Admitting: Family Medicine

## 2019-01-11 ENCOUNTER — Ambulatory Visit (INDEPENDENT_AMBULATORY_CARE_PROVIDER_SITE_OTHER): Payer: No Typology Code available for payment source | Admitting: Family Medicine

## 2019-01-11 ENCOUNTER — Encounter: Payer: Self-pay | Admitting: Family Medicine

## 2019-01-11 DIAGNOSIS — A549 Gonococcal infection, unspecified: Secondary | ICD-10-CM | POA: Diagnosis not present

## 2019-01-11 MED ORDER — AZITHROMYCIN 500 MG PO TABS: ORAL_TABLET | ORAL | 0 refills | Status: DC

## 2019-01-11 MED ORDER — CEFTRIAXONE SODIUM 250 MG IJ SOLR
250.0000 mg | Freq: Once | INTRAMUSCULAR | Status: AC
  Administered 2019-01-11: 250 mg via INTRAMUSCULAR

## 2019-01-11 NOTE — Telephone Encounter (Signed)
See telephone encounter dated 4/8.

## 2019-01-11 NOTE — Telephone Encounter (Signed)
Please Advise

## 2019-01-11 NOTE — Telephone Encounter (Signed)
Spoke with patient who states that she has viewed her labs via MyChart.  She will come in today 4/8 @ 11 am for nurse visit for rocephin injection.  Pt verbalizes understanding of all lab results and notes per Dr. Artis Flock.

## 2019-01-11 NOTE — Telephone Encounter (Signed)
Copied from CRM [id number]. Topic: General - Other >> Jan 11, 2019  9:59 AM Jaquita Rector A wrote: Reason for CRM: Patient called back to schedule coming in to get shot that she was informed that she need. She received this notice on My Chart. Please call patient at Ph# [phone]

## 2019-01-11 NOTE — Progress Notes (Signed)
Per orders of Dr. Artis Flock, injection of Ceftriaxone 250 mg given IM by Alessandra Bevels Zellmer, CMA in right gluteus.  Patient tolerated injection well.

## 2019-03-04 ENCOUNTER — Encounter: Payer: Self-pay | Admitting: Family Medicine

## 2019-03-09 ENCOUNTER — Encounter: Payer: Self-pay | Admitting: Family Medicine

## 2019-03-09 ENCOUNTER — Ambulatory Visit (INDEPENDENT_AMBULATORY_CARE_PROVIDER_SITE_OTHER): Payer: No Typology Code available for payment source | Admitting: Family Medicine

## 2019-03-09 VITALS — Ht 65.0 in | Wt 125.0 lb

## 2019-03-09 DIAGNOSIS — Z87898 Personal history of other specified conditions: Secondary | ICD-10-CM

## 2019-03-09 NOTE — Progress Notes (Signed)
Patient: Terri Howard MRN: [id number] DOB: 1991-04-26 PCP: Orland Mustard, MD     I connected with Viviano Simas on 03/09/19 at 1:42pm by a video enabled telemedicine application and verified that I am speaking with the correct person using two identifiers.  Location patient: Home Location provider: [facility], Office Persons participating in this virtual visit: Marissa Vold and Dr. Artis Flock   I discussed the limitations of evaluation and management by telemedicine and the availability of in person appointments. The patient expressed understanding and agreed to proceed.   Subjective:  Chief Complaint  Patient presents with  . Weight Loss    HPI: The patient is a 28 y.o. female who presents today for concerns for weight loss. She tells me that she actually feels really good and has a lot of energy, but her mom and family have continued to express concerns with how thin she looks. She has started to be self conscious about this. I saw her in April to establish care and she weighed 122 pounds. Today she weighed at work and is 122.5 pounds. When I discussed that she hasn't lost any weight she states she thinks it is more from December that she has started to look thinner. She tells me she is exercising, sleeping good, has a lot of energy, enjoying work,etc. She is just tired of her family saying stuff so wanted to check in.   Of note: BMI in normal range of 20.8.  All labs wnl that were done in 01/2019 including thyroid, cbc and cmp.   She denies any blood in stool, easy bruising or shortness of breath/cough out of ordinary from her asthma. She has been off all of mental health drugs that could also cause weight loss including adderall, lithium and lamictal and feels like she is in a really good place mentally. denies any hair falling out.   Typical diet: normally doesn't eat breakfast. 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 Serina Cowper will be a smoothie or will pick something up. Today she had soup/chick  fil a sandwhich and an ensure. She just started to drink these to make sure getting calories. Dinner will be chicken/rice and veggie. She has increased her water intake and really decreased alcohol. Used to drink more wine. Walking 2+ miles/day.   Review of Systems  Constitutional: Positive for unexpected weight change. Negative for fatigue.  Eyes: Negative for visual disturbance.  Respiratory: Negative for shortness of breath.   Cardiovascular: Negative for chest pain.  Gastrointestinal: Negative for abdominal pain and nausea.  Musculoskeletal: Negative.   Skin: Negative.   Neurological: Negative for dizziness and headaches.  Psychiatric/Behavioral: Negative for sleep disturbance. The patient is not nervous/anxious.     Allergies Patient has No Known Allergies.  Past Medical History Patient  has a past medical history of Asthma, Chlamydia, GBS carrier (08/23/2011), Screening for HIV (human immunodeficiency virus), and Urinary tract infection, recurrent.  Surgical History Patient  has a past surgical history that includes Breast lumpectomy (2000).  Family History Pateint's family history includes Alcohol abuse in her father and mother; Birth defects in her maternal grandmother; Breast cancer in her maternal grandmother; COPD in her maternal grandmother; Depression in her mother; Drug abuse in her father and mother; Heart attack in her mother; Heart disease in her mother; Hyperlipidemia in her mother; Hypertension in her father, maternal aunt, maternal grandfather, maternal grandmother, maternal uncle, and mother; Kidney disease in her maternal grandmother; Mental illness in her mother; Ovarian cancer in her mother.  Social History Patient  reports that she has never smoked. She has never used smokeless tobacco. She reports current alcohol use. She reports current drug use. Drug: Marijuana.    Objective: Vitals:   03/09/19 1157  Weight: 125 lb (56.7 kg)  Height: 5\' 5"  (1.651 m)     Body mass index is 20.8 kg/m.  Physical Exam Vitals signs reviewed.  Constitutional:      Appearance: Normal appearance.  Eyes:     Extraocular Movements: Extraocular movements intact.  Pulmonary:     Effort: Pulmonary effort is normal.  Neurological:     General: No focal deficit present.     Mental Status: She is alert and oriented to person, place, and time.  Psychiatric:        Mood and Affect: Mood normal.        Behavior: Behavior normal.        Assessment/plan: 1. History of weight loss No weight loss since I saw her in April. Unsure what weight was in December. Reassured her labs normal inlcuindg thyroid and albumin in April and her bmi is in the normal range. I do not think she is likely getting enough calories so recommended a 2000 calorie diet. She is to keep a food journal over the next 3 months time. Also recommend app on phone called my fitness pal to track calories and we can see what her diet is like. If losing weight or still feels like she is too skinny in 3 months we can repeat labs, check CXR and recommended we get her in with nutrition to discuss dietary choices. She also thinks part of this is cultural as her family/mom likely thinks she needs to have more weight even though she is at a healthy BMI. Will f/u in 3 months time.   Return in about 3 months (around 06/09/2019) for weight/check in .    Orland MustardAllison Mivaan Corbitt, MD [place] [facility]  03/09/2019

## 2019-04-10 ENCOUNTER — Ambulatory Visit (HOSPITAL_COMMUNITY): Payer: No Typology Code available for payment source | Admitting: Psychiatry

## 2019-04-10 ENCOUNTER — Telehealth (HOSPITAL_COMMUNITY): Payer: Self-pay | Admitting: Psychiatry

## 2019-04-10 ENCOUNTER — Other Ambulatory Visit: Payer: Self-pay

## 2019-04-10 NOTE — Telephone Encounter (Signed)
Therapist attempted contact twice by text message and once by phone call for scheduled appointment. Therapist left voice mail message indicating attempt and requested patient call office.

## 2019-06-14 ENCOUNTER — Encounter: Payer: Self-pay | Admitting: Family Medicine

## 2019-06-15 ENCOUNTER — Other Ambulatory Visit: Payer: Self-pay

## 2019-06-15 MED ORDER — ALBUTEROL SULFATE HFA 108 (90 BASE) MCG/ACT IN AERS: 1.0000 | INHALATION_SPRAY | Freq: Four times a day (QID) | RESPIRATORY_TRACT | 1 refills | Status: DC | PRN

## 2019-06-15 MED FILL — ALBUTEROL SULFATE HFA 108 (: 108 (90 BAS | 25 days supply | Qty: 9 | Fill #0

## 2019-07-06 ENCOUNTER — Encounter: Payer: Self-pay | Admitting: Family Medicine

## 2019-07-07 ENCOUNTER — Encounter: Payer: Self-pay | Admitting: Family Medicine

## 2019-07-24 ENCOUNTER — Encounter: Payer: Self-pay | Admitting: Family Medicine

## 2019-07-28 ENCOUNTER — Encounter: Payer: Self-pay | Admitting: Family Medicine

## 2019-08-24 ENCOUNTER — Encounter: Payer: Self-pay | Admitting: Family Medicine

## 2019-08-24 MED ORDER — FLUTICASONE-SALMETEROL 250-50 MCG/DOSE IN AEPB: 1.0000 | INHALATION_SPRAY | Freq: Two times a day (BID) | RESPIRATORY_TRACT | 3 refills | Status: DC

## 2019-08-24 MED ORDER — ALBUTEROL SULFATE HFA 108 (90 BASE) MCG/ACT IN AERS: 1.0000 | INHALATION_SPRAY | Freq: Four times a day (QID) | RESPIRATORY_TRACT | 1 refills | Status: DC | PRN

## 2019-08-24 NOTE — Telephone Encounter (Signed)
Patient last seen on 03/09/19. Told to follow up in 3 mo. Ok to refill?

## 2019-09-04 MED FILL — ALBUTEROL SULFATE HFA 108 (: 108 (90 BAS | 25 days supply | Qty: 9 | Fill #1

## 2019-10-05 MED FILL — ALBUTEROL SULFATE HFA 108 (: 108 (90 BAS | 25 days supply | Qty: 9 | Fill #1

## 2019-10-06 ENCOUNTER — Encounter: Payer: Self-pay | Admitting: Family Medicine

## 2019-10-06 NOTE — L&D Delivery Note (Signed)
Delivery Note:   G2P1001 at [redacted]w[redacted]d  Admitting diagnosis: Normal labor [O80, Z37.9] Risks: 1.ROM > 48 hrs 2. Asthma, no meds 3. Bipolar 2 disorder, no meds  Onset of labor: 1700 IOL/Augmentation: declined ROM: LOF clear since 9/24 evening Forebag ruptured with delivery, clear AF  Complete dilation at   2339 Onset of pushing at 2339 FHR second stage cat 1  Analgesia Terri Howard intrapartum:None  Pushing in [place] position with CNM and L&D staff support, Midwife and Terri Howard/MIL present for birth and supportive.  Delivery of a Live born female  Birth Weight:  3150 g Weight:, English: 6 lb 15.1 oz APGAR: 9, 9  Newborn Delivery   Birth date/time: 06/30/2020 23:42:00 Delivery type: Vaginal, Spontaneous      in cephalic presentation, position OA to ROT.  Nuchal Cord: No  Cord double clamped after placenta delivered, cut by Terri Howard.  Collection of cord blood for typing completed. Cord blood donation-None  Arterial cord blood sample-No    Placenta delivered-Spontaneous  with 3 vessels . Uterotonics: Pitocin IV bolus Placenta to parents per request. Uterine tone firm with massage, bleeding small  No laceration identified.  Episiotomy:none Local analgesia: NA  Repair: NA Est. Blood Loss (mL):300 Complications: ROM>24 hours   Mom to postpartum.  Baby Terri Howard to Couplet care / Skin to Skin.  Delivery Report:  Review the Delivery Report for details.     Signed: Neta Howard, CNM, MSN 07/01/2020, 12:01 AM

## 2019-10-09 ENCOUNTER — Other Ambulatory Visit: Payer: Self-pay | Admitting: Family Medicine

## 2019-10-09 DIAGNOSIS — J4540 Moderate persistent asthma, uncomplicated: Secondary | ICD-10-CM

## 2019-10-09 MED ORDER — ALBUTEROL SULFATE (2.5 MG/3ML) 0.083% IN NEBU: 2.5000 mg | INHALATION_SOLUTION | Freq: Four times a day (QID) | RESPIRATORY_TRACT | 1 refills | Status: DC | PRN

## 2020-02-27 ENCOUNTER — Other Ambulatory Visit: Payer: Self-pay | Admitting: Obstetrics & Gynecology

## 2020-02-27 DIAGNOSIS — Z331 Pregnant state, incidental: Secondary | ICD-10-CM

## 2020-03-13 ENCOUNTER — Encounter: Payer: Self-pay | Admitting: *Deleted

## 2020-03-15 ENCOUNTER — Other Ambulatory Visit: Payer: Self-pay

## 2020-03-15 ENCOUNTER — Other Ambulatory Visit: Payer: Self-pay | Admitting: *Deleted

## 2020-03-15 ENCOUNTER — Ambulatory Visit: Payer: No Typology Code available for payment source | Attending: Obstetrics and Gynecology

## 2020-03-15 DIAGNOSIS — O98513 Other viral diseases complicating pregnancy, third trimester: Secondary | ICD-10-CM

## 2020-03-15 DIAGNOSIS — Z363 Encounter for antenatal screening for malformations: Secondary | ICD-10-CM

## 2020-03-15 DIAGNOSIS — Z362 Encounter for other antenatal screening follow-up: Secondary | ICD-10-CM

## 2020-03-15 DIAGNOSIS — J45909 Unspecified asthma, uncomplicated: Secondary | ICD-10-CM

## 2020-03-15 DIAGNOSIS — Z3A23 23 weeks gestation of pregnancy: Secondary | ICD-10-CM

## 2020-03-15 DIAGNOSIS — B009 Herpesviral infection, unspecified: Secondary | ICD-10-CM

## 2020-03-15 DIAGNOSIS — Z3685 Encounter for antenatal screening for Streptococcus B: Secondary | ICD-10-CM | POA: Diagnosis not present

## 2020-03-15 DIAGNOSIS — Z331 Pregnant state, incidental: Secondary | ICD-10-CM | POA: Diagnosis present

## 2020-03-15 DIAGNOSIS — O99891 Other specified diseases and conditions complicating pregnancy: Secondary | ICD-10-CM

## 2020-04-12 ENCOUNTER — Other Ambulatory Visit: Payer: Self-pay

## 2020-04-12 ENCOUNTER — Ambulatory Visit: Payer: Medicaid Other | Admitting: *Deleted

## 2020-04-12 ENCOUNTER — Ambulatory Visit: Payer: Medicaid Other | Attending: Obstetrics and Gynecology

## 2020-04-12 VITALS — BP 91/54 | HR 82

## 2020-04-12 DIAGNOSIS — O0990 Supervision of high risk pregnancy, unspecified, unspecified trimester: Secondary | ICD-10-CM

## 2020-04-12 DIAGNOSIS — F319 Bipolar disorder, unspecified: Secondary | ICD-10-CM

## 2020-04-12 DIAGNOSIS — O99342 Other mental disorders complicating pregnancy, second trimester: Secondary | ICD-10-CM

## 2020-04-12 DIAGNOSIS — O99891 Other specified diseases and conditions complicating pregnancy: Secondary | ICD-10-CM

## 2020-04-12 DIAGNOSIS — O98512 Other viral diseases complicating pregnancy, second trimester: Secondary | ICD-10-CM

## 2020-04-12 DIAGNOSIS — Z3685 Encounter for antenatal screening for Streptococcus B: Secondary | ICD-10-CM

## 2020-04-12 DIAGNOSIS — B009 Herpesviral infection, unspecified: Secondary | ICD-10-CM

## 2020-04-12 DIAGNOSIS — J45909 Unspecified asthma, uncomplicated: Secondary | ICD-10-CM

## 2020-04-12 DIAGNOSIS — Z362 Encounter for other antenatal screening follow-up: Secondary | ICD-10-CM | POA: Insufficient documentation

## 2020-04-12 DIAGNOSIS — Z3A27 27 weeks gestation of pregnancy: Secondary | ICD-10-CM

## 2020-04-18 ENCOUNTER — Encounter: Payer: Self-pay | Admitting: Family Medicine

## 2020-05-10 ENCOUNTER — Encounter: Payer: Self-pay | Admitting: Family Medicine

## 2020-06-30 ENCOUNTER — Encounter (HOSPITAL_COMMUNITY): Payer: Self-pay | Admitting: Obstetrics & Gynecology

## 2020-06-30 ENCOUNTER — Inpatient Hospital Stay (HOSPITAL_COMMUNITY)
Admission: AD | Admit: 2020-06-30 | Discharge: 2020-07-02 | DRG: 807 | Disposition: A | Discharge status: Home / Self Care | Payer: Medicaid Other | Attending: Obstetrics & Gynecology | Admitting: Obstetrics & Gynecology

## 2020-06-30 ENCOUNTER — Other Ambulatory Visit: Payer: Self-pay

## 2020-06-30 DIAGNOSIS — O99824 Streptococcus B carrier state complicating childbirth: Secondary | ICD-10-CM | POA: Diagnosis present

## 2020-06-30 DIAGNOSIS — Z3A39 39 weeks gestation of pregnancy: Secondary | ICD-10-CM

## 2020-06-30 DIAGNOSIS — Z20822 Contact with and (suspected) exposure to covid-19: Secondary | ICD-10-CM | POA: Diagnosis present

## 2020-06-30 DIAGNOSIS — O26893 Other specified pregnancy related conditions, third trimester: Secondary | ICD-10-CM | POA: Diagnosis present

## 2020-06-30 DIAGNOSIS — J4540 Moderate persistent asthma, uncomplicated: Secondary | ICD-10-CM | POA: Diagnosis present

## 2020-06-30 DIAGNOSIS — F3181 Bipolar II disorder: Secondary | ICD-10-CM | POA: Diagnosis present

## 2020-06-30 LAB — CBC
HCT: 44 % (ref 36.0–46.0)
Hemoglobin: 13.9 g/dL (ref 12.0–15.0)
MCH: 28.6 pg (ref 26.0–34.0)
MCHC: 31.6 g/dL (ref 30.0–36.0)
MCV: 90.5 fL (ref 80.0–100.0)
Platelets: 178 10*3/uL (ref 150–400)
RBC: 4.86 MIL/uL (ref 3.87–5.11)
RDW: 13 % (ref 11.5–15.5)
WBC: 7.2 10*3/uL (ref 4.0–10.5)
nRBC: 0 % (ref 0.0–0.2)

## 2020-06-30 LAB — RESPIRATORY PANEL BY RT PCR (FLU A&B, COVID)
Influenza A by PCR: NEGATIVE
Influenza B by PCR: NEGATIVE
SARS Coronavirus 2 by RT PCR: NEGATIVE

## 2020-06-30 LAB — TYPE AND SCREEN
ABO/RH(D): O POS
Antibody Screen: NEGATIVE

## 2020-06-30 LAB — POCT FERN TEST: POCT Fern Test: POSITIVE

## 2020-06-30 MED ORDER — LACTATED RINGERS IV SOLN: INTRAVENOUS | Status: DC

## 2020-06-30 MED ORDER — SODIUM CHLORIDE 0.9% FLUSH: 3.0000 mL | INTRAVENOUS | Status: DC | PRN

## 2020-06-30 MED ORDER — OXYTOCIN-SODIUM CHLORIDE 30-0.9 UT/500ML-% IV SOLN
2.5000 [IU]/h | INTRAVENOUS | Status: DC
  Administered 2020-07-01: 2.5 [IU]/h via INTRAVENOUS
  Filled 2020-06-30: qty 500

## 2020-06-30 MED ORDER — SODIUM CHLORIDE 0.9% FLUSH: 3.0000 mL | Freq: Two times a day (BID) | INTRAVENOUS | Status: DC

## 2020-06-30 MED ORDER — OXYTOCIN BOLUS FROM INFUSION
333.0000 mL | Freq: Once | INTRAVENOUS | Status: AC
  Administered 2020-06-30: 333 mL via INTRAVENOUS

## 2020-06-30 MED ORDER — LIDOCAINE HCL (PF) 1 % IJ SOLN: 30.0000 mL | INTRAMUSCULAR | Status: DC | PRN

## 2020-06-30 MED ORDER — SODIUM CHLORIDE 0.9 % IV SOLN: 250.0000 mL | INTRAVENOUS | Status: DC | PRN

## 2020-06-30 MED ORDER — LACTATED RINGERS IV SOLN: 500.0000 mL | INTRAVENOUS | Status: DC | PRN

## 2020-06-30 MED ORDER — ACETAMINOPHEN 500 MG PO TABS: 1000.0000 mg | ORAL_TABLET | Freq: Four times a day (QID) | ORAL | Status: DC | PRN

## 2020-06-30 MED ORDER — OXYTOCIN 10 UNIT/ML IJ SOLN: 10.0000 [IU] | Freq: Once | INTRAMUSCULAR | Status: DC

## 2020-06-30 MED ORDER — FENTANYL CITRATE (PF) 100 MCG/2ML IJ SOLN: 50.0000 ug | INTRAMUSCULAR | Status: DC | PRN

## 2020-06-30 MED ORDER — SOD CITRATE-CITRIC ACID 500-334 MG/5ML PO SOLN: 30.0000 mL | ORAL | Status: DC | PRN

## 2020-06-30 MED ORDER — ONDANSETRON HCL 4 MG/2ML IJ SOLN: 4.0000 mg | Freq: Four times a day (QID) | INTRAMUSCULAR | Status: DC | PRN

## 2020-06-30 NOTE — MAU Note (Signed)
Terri Howard is a 29 y.o. at [redacted]w[redacted]d here in MAU reporting: pt reports she is in active labor. Having contractions every 4-5 minutes for the past 30 minutes. States started contracting yesterday but today they got closer. Having some bloody show. +FM  Onset of complaint: yesterday  Pain score: 6/10  Vitals:   06/30/20 1757  BP: (!) 107/53  Pulse: 75  Resp: 16  Temp: 98.6 F (37 C)  SpO2: 98%     FHT: +FM  Lab orders placed from triage: none

## 2020-06-30 NOTE — Progress Notes (Signed)
Terri Howard is a 28 y.o. G2P1001 at [redacted]w[redacted]d by LMP admitted for LOF and contractions  Subjective: Reports ctx are variable in intensity, able to cope. Has Terri Howard-partner and Terri Howard, his mother, for support.  Desires low intervention labor.  After further discussion, patient mentioned noting small gush of fluid while doing yoga poses the night before last. Notes clear fluid which has been different than her usual white discharge, and small intermittent gushes since then.   Objective: Vitals:   06/30/20 1754 06/30/20 1757 06/30/20 1848 06/30/20 1939  BP:  (!) 107/53 107/64 107/64  Pulse:  75 (!) 53 64  Resp:  16 18 16   Temp:  98.6 F (37 C) 98 F (36.7 C) 98 F (36.7 C)  TempSrc:  Oral Oral Oral  SpO2:  98%    Weight: 71.2 kg     Height: 5\' 5"  (1.651 m)        FHT:  FHR: 120 bpm, variability: moderate,  accelerations:  Present,  decelerations:  Absent UC:   irregular, every 5-7 minutes SVE:   deferred  Labs:   Recent Labs    06/30/20 1913  WBC 7.2  HGB 13.9  HCT 44.0  PLT 178    Assessment / Plan: G2P1001 29 y.o. [redacted]w[redacted]d Protracted latent phase  Suspect prolonged SROM > 48 hrs.  No s/sx of chorio   Labor: Latency, advised augmentation with Pitocin or Cytotec. Increased risk for chorio with more wait time discussed Desires to wait for another hour or two for physiologic labor to start before reexamining and making decision on augmentation.  Preeclampsia:  no signs or symptoms of toxicity Fetal Wellbeing:  Category I Pain Control:  Labor support without medications I/D:  GBS neg Anticipated MOD:  NSVD  07/02/20, CNM, MSN 06/30/2020, 8:15 PM

## 2020-06-30 NOTE — Progress Notes (Signed)
S: Doing well, coping well with ctx. Feels more rectal pressure w/ ctx, noted some pink fluid gushes, and requests exam.  Family at [facility] and supportive.   O: Vitals:   06/30/20 1848 06/30/20 1939 06/30/20 2126 06/30/20 2223  BP: 107/64 107/64 109/61 (!) 102/53  Pulse: (!) 53 64 63 72  Resp: 18 16  18   Temp: 98 F (36.7 C) 98 F (36.7 C) 98.5 F (36.9 C)   TempSrc: Oral Oral Oral   SpO2:      Weight:      Height:         FHT:  FHR: 120 bpm, variability: moderate,  accelerations:  Present,  decelerations:  Absent UC:   regular, every 2-3 minutes SVE:   Dilation: 9 Effacement (%): 100 Station: -1 Exam by:: [email or web address], CNM Forebag bulging  A / P: Spontaneous labor, progressing normally  Fetal Wellbeing:  Category I Pain Control:  Labor support without medications  Anticipated MOD:  NSVD  Colon Flattery, CNM, MSN 06/30/2020, 11:03 PM

## 2020-06-30 NOTE — H&P (Signed)
OB ADMISSION/ HISTORY & PHYSICAL:  Admission Date: 06/30/2020  5:38 PM  Admit Diagnosis: Normal labor [O80, Z37.9]    Terri Howard is a 29 y.o. female presenting for contractions since yesterday. Pt states she has been contracting since yesterday and they are becoming more intense now with bloody show. Denies leaking of fluid. Endorses + fetal movement. Boyfriend, Jeralyn Bennett, at the bedside providing support. Expecting a baby girl, Nile. Doula will be present for labor and delivery virtually.   Prenatal History: G2P1001   EDC : 07/07/2020, by Last Menstrual Period  Prenatal care at [facility] since 8 weeks  Prenatal course complicated by: 1. Asthma, no meds 2. Bipolar 2 disorder, no meds  Prenatal Labs: ABO, Rh:   O POS Antibody:   Negative Rubella:   Immune RPR:   Non-reactive HBsAg:   Negative HIV:   Negative GBS:   Negative 1 hr Glucola : 29 Genetic Screening: low risk female Ultrasound: normal anatomy, anterior placenta, vertex, AFI WNL, EFW 42%    Maternal Diabetes: No Genetic Screening: Normal Maternal Ultrasounds/Referrals: Normal Fetal Ultrasounds or other Referrals:  None Maternal Substance Abuse:  No Significant Maternal Medications:  None Significant Maternal Lab Results:  Group B Strep negative Other Comments:  None  Medical / Surgical History :  Past medical history:  Past Medical History:  Diagnosis Date  . Asthma   . Chlamydia   . GBS carrier 08/23/2011  . Screening for HIV (human immunodeficiency virus)   . Urinary tract infection, recurrent     Past surgical history:  Past Surgical History:  Procedure Laterality Date  . BREAST LUMPECTOMY  2000   fibroadenma    Family History:  Family History  Problem Relation Age of Onset  . Ovarian cancer Mother   . Heart disease Mother   . Hypertension Mother   . Mental illness Mother   . Alcohol abuse Mother   . Depression Mother   . Drug abuse Mother   . Heart attack Mother   . Hyperlipidemia Mother   .  Hypertension Father   . Alcohol abuse Father   . Drug abuse Father   . Hypertension Maternal Aunt   . Hypertension Maternal Uncle   . Hypertension Maternal Grandmother   . Kidney disease Maternal Grandmother   . COPD Maternal Grandmother   . Breast cancer Maternal Grandmother   . Birth defects Maternal Grandmother   . Hypertension Maternal Grandfather     Social History:  reports that she has never smoked. She has never used smokeless tobacco. She reports previous alcohol use. She reports previous drug use. Drug: Marijuana.  Allergies: Patient has no known allergies.   Current Medications at time of admission:  Medications Prior to Admission  Medication Sig Dispense Refill Last Dose  . albuterol (PROVENTIL) (2.5 MG/3ML) 0.083% nebulizer solution Take 3 mLs (2.5 mg total) by nebulization every 6 (six) hours as needed for wheezing or shortness of breath. 150 mL 1   . albuterol (VENTOLIN HFA) 108 (90 Base) MCG/ACT inhaler Inhale 1-2 puffs into the lungs every 6 (six) hours as needed for wheezing or shortness of breath. 18 g 1   . amphetamine-dextroamphetamine (ADDERALL) 20 MG tablet dextroamphetamine-amphetamine 20 mg tablet  TAKE 1 TABLET TWICE A DAY (Patient not taking: Reported on 04/12/2020)     . Fluticasone-Salmeterol (ADVAIR DISKUS) 250-50 MCG/DOSE AEPB Inhale 1 puff into the lungs 2 (two) times daily. (Patient not taking: Reported on 04/12/2020) 60 each 3   . lamoTRIgine (LAMICTAL) 25 MG tablet  Take 1 tablet (25 mg total) by mouth daily. For mood stabilization (Patient not taking: Reported on 03/09/2019) 30 tablet 0   . LITHIUM PO Take by mouth. (Patient not taking: Reported on 04/12/2020)     . Prenatal Vit-Fe Fumarate-FA (PRENATAL VITAMIN PO) Take by mouth.       Review of Systems: Review of Systems  All other systems reviewed and are negative.  Physical Exam: Vital signs and nursing notes reviewed.  Patient Vitals for the past 24 hrs:  BP Temp Temp src Pulse Resp SpO2 Height  Weight  06/30/20 1757 (!) 107/53 98.6 F (37 C) Oral 75 16 98 % -- --  06/30/20 1754 -- -- -- -- -- -- 5\' 5"  (1.651 m) 71.2 kg    General: AAO x 3, NAD, coping well Heart: RRR Lungs:CTAB Abdomen: Gravid, NT, Leopold's 7-7.5 pounds Extremities: no edema Genitalia / VE: Dilation: 3 Effacement (%): 90 Station: 0 Presentation: Vertex Exam by:: [email or web address] CNM   FHR: 130BPM, mod variability, + accels, no decels TOCO: Ctx irregular  Labs:   Pending T&S, CBC, RPR  No results for input(s): WBC, HGB, HCT, PLT in the last 72 hours.   Assessment:  29 y.o. G2P1001 at [redacted]w[redacted]d, SROM at unknown time, latent labor  1. Latent stage of labor 2. FHR category 1 3. GBS negative 4. Desires natural childbirth, doula present virtually 5. Plans to breastfeed 6. Placenta disposal per patient request 7. Bipolar 2 on no medications  Plan:  1. Admit to BS 2. Routine L&D orders 3. Analgesia/anesthesia PRN  4. Expectant management 5. Anticipate NSVB 6. Close postpartum follow-up for risk of PPD and bipolar disorder  Dr. [redacted]w[redacted]d notified of admission / plan of care  Richardson Dopp CNM, MSN 06/30/2020, 6:49 PM

## 2020-07-01 ENCOUNTER — Encounter (HOSPITAL_COMMUNITY): Payer: Self-pay | Admitting: Obstetrics and Gynecology

## 2020-07-01 LAB — CBC
HCT: 37.3 % (ref 36.0–46.0)
Hemoglobin: 11.9 g/dL — ABNORMAL LOW (ref 12.0–15.0)
MCH: 28.6 pg (ref 26.0–34.0)
MCHC: 31.9 g/dL (ref 30.0–36.0)
MCV: 89.7 fL (ref 80.0–100.0)
Platelets: 157 10*3/uL (ref 150–400)
RBC: 4.16 MIL/uL (ref 3.87–5.11)
RDW: 12.8 % (ref 11.5–15.5)
WBC: 9.8 10*3/uL (ref 4.0–10.5)
nRBC: 0 % (ref 0.0–0.2)

## 2020-07-01 LAB — RPR: RPR Ser Ql: NONREACTIVE

## 2020-07-01 MED ORDER — ONDANSETRON HCL 4 MG PO TABS: 4.0000 mg | ORAL_TABLET | ORAL | Status: DC | PRN

## 2020-07-01 MED ORDER — INFLUENZA VAC SPLIT QUAD 0.5 ML IM SUSY: 0.5000 mL | PREFILLED_SYRINGE | Freq: Once | INTRAMUSCULAR | Status: DC

## 2020-07-01 MED ORDER — TETANUS-DIPHTH-ACELL PERTUSSIS 5-2.5-18.5 LF-MCG/0.5 IM SUSP: 0.5000 mL | Freq: Once | INTRAMUSCULAR | Status: DC

## 2020-07-01 MED ORDER — BISACODYL 10 MG RE SUPP: 10.0000 mg | Freq: Every day | RECTAL | Status: DC | PRN

## 2020-07-01 MED ORDER — IBUPROFEN 600 MG PO TABS
600.0000 mg | ORAL_TABLET | Freq: Four times a day (QID) | ORAL | Status: DC
  Administered 2020-07-01 – 2020-07-02 (×6): 600 mg via ORAL
  Filled 2020-07-01 (×6): qty 1

## 2020-07-01 MED ORDER — WITCH HAZEL-GLYCERIN EX PADS: 1.0000 "application " | MEDICATED_PAD | CUTANEOUS | Status: DC | PRN

## 2020-07-01 MED ORDER — ACETAMINOPHEN 500 MG PO TABS
1000.0000 mg | ORAL_TABLET | Freq: Four times a day (QID) | ORAL | Status: DC
  Administered 2020-07-01 – 2020-07-02 (×6): 1000 mg via ORAL
  Filled 2020-07-01 (×6): qty 2

## 2020-07-01 MED ORDER — ONDANSETRON HCL 4 MG/2ML IJ SOLN: 4.0000 mg | INTRAMUSCULAR | Status: DC | PRN

## 2020-07-01 MED ORDER — SIMETHICONE 80 MG PO CHEW: 80.0000 mg | CHEWABLE_TABLET | ORAL | Status: DC | PRN

## 2020-07-01 MED ORDER — DIBUCAINE (PERIANAL) 1 % EX OINT: 1.0000 "application " | TOPICAL_OINTMENT | CUTANEOUS | Status: DC | PRN

## 2020-07-01 MED ORDER — COCONUT OIL OIL
1.0000 "application " | TOPICAL_OIL | Status: DC | PRN
  Administered 2020-07-01: 1 via TOPICAL

## 2020-07-01 MED ORDER — BENZOCAINE-MENTHOL 20-0.5 % EX AERO
1.0000 "application " | INHALATION_SPRAY | CUTANEOUS | Status: DC | PRN
  Administered 2020-07-01: 1 via TOPICAL
  Filled 2020-07-01: qty 56

## 2020-07-01 MED ORDER — DIPHENHYDRAMINE HCL 25 MG PO CAPS: 25.0000 mg | ORAL_CAPSULE | Freq: Four times a day (QID) | ORAL | Status: DC | PRN

## 2020-07-01 MED ORDER — ZOLPIDEM TARTRATE 5 MG PO TABS: 5.0000 mg | ORAL_TABLET | Freq: Every evening | ORAL | Status: DC | PRN

## 2020-07-01 MED ORDER — PRENATAL MULTIVITAMIN CH
1.0000 | ORAL_TABLET | Freq: Every day | ORAL | Status: DC
  Administered 2020-07-01 – 2020-07-02 (×2): 1 via ORAL
  Filled 2020-07-01 (×2): qty 1

## 2020-07-01 MED ORDER — FLEET ENEMA 7-19 GM/118ML RE ENEM: 1.0000 | ENEMA | Freq: Every day | RECTAL | Status: DC | PRN

## 2020-07-01 MED ORDER — SENNOSIDES-DOCUSATE SODIUM 8.6-50 MG PO TABS
2.0000 | ORAL_TABLET | ORAL | Status: DC
  Administered 2020-07-01: 2 via ORAL
  Filled 2020-07-01: qty 2

## 2020-07-01 NOTE — Progress Notes (Signed)
PPD# 1 SVD w/ intact perineum Information for the patient's newborn:  Leianna, Barga [[id number]]  female    Baby Name Nile Circumcision N/A   S:   Reports feeling good Tolerating PO fluid and solids No nausea or vomiting Bleeding is light, moderate, no clots Pain controlled with PO meds Up ad lib / ambulatory / voiding w/o difficulty Feeding: Breast    O:   VS: BP 109/73 (BP Location: Right Arm)   Pulse (!) 55   Temp 98.4 F (36.9 C) (Oral)   Resp 18   Ht 5\' 5"  (1.651 m)   Wt 71.2 kg   LMP 10/01/2019   SpO2 100%   Breastfeeding Unknown   BMI 26.11 kg/m   LABS:  Recent Labs    06/30/20 1913 07/01/20 0430  WBC 7.2 9.8  HGB 13.9 11.9*  PLT 178 157   Blood type: --/--/O POS (09/26 1913) Rubella:                        I&O: Intake/Output      09/26 0701 - 09/27 0700 09/27 0701 - 09/28 0700   I.V. (mL/kg) 0 (0)    Total Intake(mL/kg) 0 (0)    Blood 300    Total Output 300    Net -300           Physical Exam: Alert and oriented X3 Lungs: Clear and unlabored Heart: regular rate and rhythm / no mumurs Abdomen: soft, non-tender, non-distended  Fundus: firm, non-tender Perineum: intact, non-edematous Lochia: appropriate Extremities: no edema, no calf pain or tenderness    A:  PPD # 1  Normal exam Hx of bipolar disorder    -stable, no meds, managed via therapy    -SW consult completed  P:  Routine post partum orders Anticipate D/C on 07/02/20   Plan reviewed w/ Dr. 07/04/20, MSN, CNM 07/01/2020, 12:16 PM

## 2020-07-01 NOTE — Lactation Note (Signed)
This note was copied from a baby's chart. Lactation Consultation Note  Patient Name: Terri Howard [id number] Date: 07/01/2020 Reason for consult: Initial assessment;Term P2, 3 hour term female infant. Mom is experienced at breastfeeding she BF her 31 year old son for 16 months. Per mom, she feels infant is latching well at the breast, infant BF twice since delivery for 30 minutes each. Mom was concern if infant was receiving any milk, LC discussed hand expression and mom easily expressed 3 mls of colostrum from her right breast as she was doing STS with infant. Mom felt reassured that she has EBM to give infant. LC discussed infant's small tummy size the first few days of life.  Mom will offer the EBM after she latches infant at the next feeding.  Mom was given a hand pump for prn and she is ordering her DEBP with her [employer].  Mom understands to BF infant according to cues, 8 to 12+ times within a 24 hours period, STS. Mom knows to call RN or LC if she has questions, concerns or need assistance with latching infant at the breast. Mom made aware of O/P services, breastfeeding support groups, community resources, and our phone # for post-discharge questions.  Maternal Data Has patient been taught Hand Expression?: Yes Does the patient have breastfeeding experience prior to this delivery?: Yes  Feeding Feeding Type: Breast Fed  LATCH Score Latch: Repeated attempts needed to sustain latch, nipple held in mouth throughout feeding, stimulation needed to elicit sucking reflex.  Audible Swallowing: A few with stimulation  Type of Nipple: Everted at rest and after stimulation  Comfort (Breast/Nipple): Soft / non-tender  Hold (Positioning): No assistance needed to correctly position infant at breast.  LATCH Score: 8  Interventions Interventions: Breast feeding basics reviewed;Breast compression;Skin to skin;Hand express;Expressed milk;Hand pump  Lactation Tools  Discussed/Used Tools: Pump;Flanges Flange Size: 27 Breast pump type: Manual WIC Program: No Pump Review: Setup, frequency, and cleaning;Milk Storage Initiated by:: Danelle Earthly, IBCLC Date initiated:: 07/01/20   Consult Status Consult Status: Follow-up Date: 07/02/20 Follow-up type: In-patient    Danelle Earthly 07/01/2020, 3:00 AM

## 2020-07-01 NOTE — Lactation Note (Signed)
This note was copied from a baby's chart. Lactation Consultation Note  Patient Name: Terri Howard Today's Date: 07/01/2020  Lights off on arrival.  Mom reports she is just waking up to feed her. Baby Terri Nile Rose now 22 hours old.  Infant latched in dark on arrival.  Mom denies need for lactation at this time.   LC inquired about poops and mom reports no poop yet.  She is close to 24 hours. Lots of voids. Inquired about breast and nipple soreness.  Mom reports that her nipples are starting to get sore and she has coconut oil.  Urged mom to always hand express and pat expressed mothers milk on nipples and air dry prior to applying the coconut oil.  Discussed hand expression and spoon feeding.  Mom reports someone showed her how but she has not been doing it. Urged mom to start doing some hand expression and spoon feeding past breastfeeding.  Urged mom to call lactation as needed.   Maternal Data    Feeding    LATCH Score                   Interventions    Lactation Tools Discussed/Used     Consult Status      Terri Howard 07/01/2020, 10:41 PM

## 2020-07-01 NOTE — Clinical Social Work Maternal (Signed)
CLINICAL SOCIAL WORK MATERNAL/CHILD NOTE  Patient Details  Name: Terri Howard MRN: [id number] Date of Birth: 06/23/1991  Date:  07/01/2020  Clinical Social Worker Initiating Note:  Ashlynd Michna, LCSWA Date/Time: Initiated:  07/01/20/0900     Child's Name:  Terri Howard   Biological Parents:  Mother   Need for Interpreter:  None   Reason for Referral:  Behavioral Health Concerns   Address:  [address] [place] [address]    Phone number:  [phone] (home)     Additional phone number:   Household Members/Support Persons (HM/SP):   Household Member/Support Person 1, Household Member/Support Person 2   HM/SP Name Relationship DOB or Age  HM/SP -1 [name] Howard Boyfriend    HM/SP -2 Isaiah Farrow Son 6  HM/SP -3        HM/SP -4        HM/SP -5        HM/SP -6        HM/SP -7        HM/SP -8          Natural Supports (not living in the home):  Extended Family   Professional Supports: Therapist   Employment: Full-time   Type of Work: Therapist   Education:  Graduate degree   Homebound arranged:    Financial Resources:      Other Resources:  Food Stamps    Cultural/Religious Considerations Which May Impact Care:    Strengths:  Ability to meet basic needs , Home prepared for child , Pediatrician chosen, Understanding of illness   Psychotropic Medications:         Pediatrician:    [place] area  Pediatrician List:   [place] [facility] [facility]  [place]    [place]    [place]    [place]    [place]      Pediatrician Fax Number:    Risk Factors/Current Problems:  Mental Health Concerns    Cognitive State:  Insightful , Alert    Mood/Affect:  Happy , Bright , Calm    CSW Assessment: CSW consulted for mental health history of Bipolar II. CSW met with MOB to offer support and complete assessment. CSW introduced self and role. CSW informed MOB of reason for consult, MOB  expressed understanding. CSW observed baby breast feeding. CSW asked MOB about mental health history. MOB disclosed she was diagnosed with Bipolar II at age 29. MOB stated she has no other mental health diagnosis. MOB stated she was last on medications for Bipolar II in 2018. MOB stated she currently sees a therapist, which is helpful. MOB expressed she is currently feeling good and grateful for the birth of her daughter.MOB denied experiencing any SI, HI or being involved in any DV. MOB identified FOB [name] Howard and her mother-in-law Yolanda Lawrence as supports.   CSW provided education regarding the baby blues period vs. perinatal mood disorders, discussed treatment and gave resources for mental health follow up if concerns arise.  CSW recommends self-evaluation during the postpartum time period using the New Mom Checklist from Postpartum Progress and encouraged MOB to contact a medical professional if symptoms are noted at any time.    CSW provided review of Sudden Infant Death Syndrome (SIDS) precautions. MOB stated baby will sleep in a bedside basinet once discharged home.  MOB expressed she has all of the essential needs for baby to discharge home. MOB stated they are getting a carseat for baby today, before   discharge. CSW informed MOB that a carseat could be purchased for $30 if necessary. MOB expressed understanding. MOB stated baby will receive follow-up care at [facility]. MOB denied any transportation barriers.  MOB expressed no additional needs or concerns at thi time. CSW identifies no further need for intervention and no barriers to discharge at this time.   CSW Plan/Description:  No Further Intervention Required/No Barriers to Discharge, Sudden Infant Death Syndrome (SIDS) Education, Perinatal Mood and Anxiety Disorder (PMADs) Education    Lavette Yankovich J Jonanthan Bolender, LCSWA 07/01/2020, 9:30 AM 

## 2020-07-02 MED ORDER — ALBUTEROL SULFATE HFA 108 (90 BASE) MCG/ACT IN AERS: 1.0000 | INHALATION_SPRAY | Freq: Four times a day (QID) | RESPIRATORY_TRACT | 2 refills | Status: DC | PRN

## 2020-07-02 MED ORDER — IBUPROFEN 600 MG PO TABS
600.0000 mg | ORAL_TABLET | Freq: Four times a day (QID) | ORAL | 0 refills | Status: DC
Start: 2020-07-02 — End: 2021-02-27

## 2020-07-02 MED ORDER — BENZOCAINE-MENTHOL 20-0.5 % EX AERO: 1.0000 "application " | INHALATION_SPRAY | CUTANEOUS | Status: DC | PRN

## 2020-07-02 MED ORDER — COCONUT OIL OIL: 1.0000 "application " | TOPICAL_OIL | 0 refills | Status: DC | PRN

## 2020-07-02 MED ORDER — ACETAMINOPHEN 500 MG PO TABS
1000.0000 mg | ORAL_TABLET | Freq: Four times a day (QID) | ORAL | 0 refills | Status: DC
Start: 2020-07-02 — End: 2021-02-27

## 2020-07-02 NOTE — Plan of Care (Signed)
  Problem: Activity: Goal: Will verbalize the importance of balancing activity with adequate rest periods Outcome: Completed/Met Goal: Ability to tolerate increased activity will improve Outcome: Completed/Met   Problem: Role Relationship: Goal: Ability to demonstrate positive interaction with newborn will improve Outcome: Completed/Met

## 2020-07-02 NOTE — Discharge Instructions (Signed)

## 2020-07-02 NOTE — Lactation Note (Signed)
This note was copied from a baby's chart. Lactation Consultation Note  Patient Name: Terri Howard [id number] Date: 07/02/2020 Reason for consult: Follow-up assessment   P2, Baby recently breastfed for 30 min. Mother states hand expression is helping sore nipples. Feed on demand with cues.  Goal 8-12+ times per day after first 24 hrs.  Place baby STS if not cueing.  Reviewed engorgement care and monitoring voids/stools. No further questions or concerns at this time.   Maternal Data    Feeding Feeding Type: Breast Fed  LATCH Score                   Interventions Interventions: Breast feeding basics reviewed;Hand express;Hand pump  Lactation Tools Discussed/Used     Consult Status Consult Status: Complete Date: 07/02/20    Dahlia Byes [facility] 07/02/2020, 8:56 AM

## 2020-07-02 NOTE — Discharge Summary (Signed)
OB Discharge Summary  Patient Name: Terri Howard DOB: 08/25/1991 MRN: [id number]  Date of admission: 06/30/2020 Delivering provider: Neta Mends   Admitting diagnosis: Normal labor [O80, Z37.9] Intrauterine pregnancy: [redacted]w[redacted]d     Secondary diagnosis: Patient Active Problem List   Diagnosis Date Noted  . SVD (spontaneous vaginal delivery) 06/30/2020  . Postpartum care following vaginal delivery 9/26 06/30/2020  . Bipolar 2 disorder (HCC) 01/09/2019  . Suicide attempt by drug ingestion (HCC)   . Depression 11/07/2014  . TCA (tricyclic antidepressant) overdose of undetermined intent 11/05/2014  . chronic asthma  08/23/2011   Additional problems:none   Date of discharge: 07/02/2020   Discharge diagnosis: Principal Problem:   Postpartum care following vaginal delivery 9/26 Active Problems:   chronic asthma    Bipolar 2 disorder (HCC)   SVD (spontaneous vaginal delivery)                                                              Post partum procedures:none  Augmentation: N/A Pain control: None  Laceration:None  Episiotomy:None  Complications: None and ROM>24 hours  Hospital course:  Onset of Labor With Vaginal Delivery      29 y.o. yo [id number] at [redacted]w[redacted]d was admitted in Latent Labor on 06/30/2020. Patient had an uncomplicated labor course as follows:  Membrane Rupture Time/Date: 9/24 evening Delivery Method:Vaginal, Spontaneous  Episiotomy: None  Lacerations:  None  Patient had an uncomplicated postpartum course.  She is ambulating, tolerating a regular diet, passing flatus, and urinating well. Patient is discharged home in stable condition on 07/02/20.  Newborn Data: Birth date:06/30/2020  Birth time:11:42 PM  Gender:Female  Living status:Living  Apgars:8 ,9  Weight:3150 g   Physical exam  Vitals:   07/01/20 1250 07/01/20 1430 07/01/20 2107 07/02/20 0456  BP: 100/67 (!) 93/56 (!) 98/55 99/66  Pulse: 61 (!) 53 63 61  Resp: 16 18 16 18   Temp: 98.2 F (36.8 C) 98  F (36.7 C) 98.6 F (37 C) 98.6 F (37 C)  TempSrc: Oral Oral Oral Oral  SpO2:  100% 99% 99%  Weight:      Height:       General: alert, cooperative and no distress Lochia: appropriate Uterine Fundus: firm Incision: N/A Perineum: intact, no edema DVT Evaluation: No cords or calf tenderness. No significant calf/ankle edema. Labs: Lab Results  Component Value Date   WBC 9.8 07/01/2020   HGB 11.9 (L) 07/01/2020   HCT 37.3 07/01/2020   MCV 89.7 07/01/2020   PLT 157 07/01/2020   CMP Latest Ref Rng & Units 01/09/2019  Glucose 70 - 99 mg/dL 81  BUN 6 - 23 mg/dL 12  Creatinine 03/11/2019 - 2.35 mg/dL 3.61  Sodium 4.43 - 154 mEq/L 138  Potassium 3.5 - 5.1 mEq/L 3.7  Chloride 96 - 112 mEq/L 105  CO2 19 - 32 mEq/L 26  Calcium 8.4 - 10.5 mg/dL 8.6  Total Protein 6.0 - 8.3 g/dL 6.6  Total Bilirubin 0.2 - 1.2 mg/dL 0.5  Alkaline Phos 39 - 117 U/L 55  AST 0 - 37 U/L 16  ALT 0 - 35 U/L 14   Edinburgh Postnatal Depression Scale Screening Tool 07/02/2020 07/01/2020  I have been able to laugh and see the funny side of things. 0 (No Data)  I have  looked forward with enjoyment to things. 0 -  I have blamed myself unnecessarily when things went wrong. 1 -  I have been anxious or worried for no good reason. 1 -  I have felt scared or panicky for no good reason. 0 -  Things have been getting on top of me. 0 -  I have been so unhappy that I have had difficulty sleeping. 0 -  I have felt sad or miserable. 1 -  I have been so unhappy that I have been crying. 0 -  The thought of harming myself has occurred to me. 0 -  Edinburgh Postnatal Depression Scale Total 3 -  Reinforced signs to call, close follow up for postpartum depression risk in setting of bipolar disorder unmedicated, stable on therapy.  Vaccines: TDaP declined         Flu    Offered and declined  Discharge instruction:  per After Visit Summary,  "Understanding Mother & Baby Care" hospital booklet  After Visit Meds:  Allergies as  of 07/02/2020   No Known Allergies     Medication List    TAKE these medications   acetaminophen 500 MG tablet Commonly known as: TYLENOL Take 2 tablets (1,000 mg total) by mouth every 6 (six) hours.   albuterol 108 (90 Base) MCG/ACT inhaler Commonly known as: VENTOLIN HFA Inhale 1-2 puffs into the lungs every 6 (six) hours as needed for wheezing or shortness of breath.   benzocaine-Menthol 20-0.5 % Aero Commonly known as: DERMOPLAST Apply 1 application topically as needed for irritation (perineal discomfort).   coconut oil Oil Apply 1 application topically as needed.   ibuprofen 600 MG tablet Commonly known as: ADVIL Take 1 tablet (600 mg total) by mouth every 6 (six) hours.            Discharge Care Instructions  (From admission, onward)         Start     Ordered   07/02/20 0000  Discharge wound care:       Comments: Sitz baths 2 times /day with warm water x 1 week. May add herbals: 1 ounce dried comfrey leaf* 1 ounce calendula flowers 1 ounce lavender flowers  Supplies can be found online at [employer] sources at [employer], [employer]  1/2 ounce dried uva ursi leaves 1/2 ounce witch hazel blossoms (if you can find them) 1/2 ounce dried sage leaf 1/2 cup sea salt Directions: Bring 2 quarts of water to a boil. Turn off heat, and place 1 ounce (approximately 1 large handful) of the above mixed herbs (not the salt) into the pot. Steep, covered, for 30 minutes.  Strain the liquid well with a fine mesh strainer, and discard the herb material. Add 2 quarts of liquid to the tub, along with the 1/2 cup of salt. This medicinal liquid can also be made into compresses and peri-rinses.   07/02/20 1146          Diet: routine diet  Activity: Advance as tolerated. Pelvic rest for 6 weeks.   Postpartum contraception: Condoms  Newborn Data: Live born female  Birth Weight: 6 lb 15.1 oz (3150 g) APGAR: 8, 9  Newborn Delivery   Birth date/time:  06/30/2020 23:42:00 Delivery type: Vaginal, Spontaneous      named Nile Rose Baby Feeding: Breast Disposition:home with mother   Delivery Report:  Review the Delivery Report for details.    Follow up:  Follow-up Information    [facility].  Schedule an appointment as soon as possible for a visit.   Specialty: Obstetrics and Gynecology Why: 2 weeks PP follow up for PPD Contact information: [address] [place] [address] [phone]                Signed: Cipriano Mile, MSN 07/02/2020, 11:47 AM

## 2020-07-07 ENCOUNTER — Inpatient Hospital Stay (HOSPITAL_COMMUNITY): Admission: AD | Admit: 2020-07-07 | Payer: Medicaid Other | Source: Home / Self Care

## 2021-02-27 ENCOUNTER — Encounter: Payer: Self-pay | Admitting: Family Medicine

## 2021-02-27 ENCOUNTER — Telehealth (INDEPENDENT_AMBULATORY_CARE_PROVIDER_SITE_OTHER): Payer: Medicaid Other | Admitting: Family Medicine

## 2021-02-27 DIAGNOSIS — U071 COVID-19: Secondary | ICD-10-CM | POA: Diagnosis not present

## 2021-02-27 DIAGNOSIS — J45909 Unspecified asthma, uncomplicated: Secondary | ICD-10-CM | POA: Diagnosis not present

## 2021-02-27 MED ORDER — ALBUTEROL SULFATE HFA 108 (90 BASE) MCG/ACT IN AERS: 1.0000 | INHALATION_SPRAY | Freq: Four times a day (QID) | RESPIRATORY_TRACT | 2 refills | Status: AC | PRN

## 2021-02-27 MED ORDER — PREDNISONE 20 MG PO TABS: 40.0000 mg | ORAL_TABLET | Freq: Every day | ORAL | 0 refills | Status: DC

## 2021-02-27 NOTE — Patient Instructions (Addendum)
   ---------------------------------------------------------------------------------------------------------------------------      WORK SLIP:  Patient Terri Howard,  [birth year], was seen for a medical visit today, 02/27/21 . Please excuse from work for a COVID like illness. We advise 10 days minimum from the onset of symptoms (02/18/21) PLUS 1 day of no fever and improved symptoms. Will defer to employer for a sooner return to work if symptoms have resolved, it is greater than 5 days since the positive test and the patient can wear a high-quality, tight fitting mask such as N95 or KN95 at all times for an additional 5 days. Would also suggest COVID19 antigen testing is negative prior to return.  Sincerely: E-signature: Dr. Kriste Basque, DO [facility] Ph: [phone]   ------------------------------------------------------------------------------------------------------------------------------   -I sent the medication(s) we discussed to your pharmacy: Meds ordered this encounter  Medications  . predniSONE (DELTASONE) 20 MG tablet    Sig: Take 2 tablets (40 mg total) by mouth daily with breakfast.    Dispense:  8 tablet    Refill:  0   -albuterol as needed  -avoid dairy and red meat until all better  I hope you are feeling better soon!  Seek in person care promptly if your symptoms worsen, you are having difficulty breathing or chest pain, you have recurrence of vomiting, new concerns arise or you are not improving with treatment.  It was nice to meet you today. I help [facility] out with telemedicine visits on Tuesdays and Thursdays and am available for visits on those days. If you have any concerns or questions following this visit please schedule a follow up visit with your Primary Care doctor or seek care at a local urgent care clinic to avoid delays in care.

## 2021-02-27 NOTE — Progress Notes (Signed)
Virtual Visit via Video Note  I connected with Kaytelyn  on 02/27/21 at  6:20 PM EDT by a video enabled telemedicine application and verified that I am speaking with the correct person using two identifiers.  Location patient: home, [place] Location provider:work or home office Persons participating in the virtual visit: patient, provider  I discussed the limitations of evaluation and management by telemedicine and the availability of in person appointments. The patient expressed understanding and agreed to proceed.   HPI:  Acute telemedicine visit for COVID19: -Onset: 5/17  -Symptoms included: initially had nasal congestion, cough, HA, sore throat, felt tired, Nausea and vomiting -had some vomiting yesterday after eating a lot of meat that she does not usually eat; now better today -only a wet cough and some asthma symptoms with wheezing at times remains -Denies: CP, SOB, NVD today, excessive mucus productions, fevers, inability to eat/drink/get out of bed -Pertinent past medical history: asthma - uses albuterol as needed, has required prednisone in the past when sick -Pertinent medication allergies: nkda -COVID-19 vaccine status: not vaccinated -denies any chance of pregnancy   ROS: See pertinent positives and negatives per HPI.  Past Medical History:  Diagnosis Date  . Asthma   . Chlamydia   . GBS carrier 08/23/2011  . Screening for HIV (human immunodeficiency virus)   . Urinary tract infection, recurrent     Past Surgical History:  Procedure Laterality Date  . BREAST LUMPECTOMY  2000   fibroadenma     Current Outpatient Medications:  .  coconut oil OIL, Apply 1 application topically as needed., Disp: , Rfl: 0 .  predniSONE (DELTASONE) 20 MG tablet, Take 2 tablets (40 mg total) by mouth daily with breakfast., Disp: 8 tablet, Rfl: 0 .  albuterol (VENTOLIN HFA) 108 (90 Base) MCG/ACT inhaler, Inhale 1-2 puffs into the lungs every 6 (six) hours as needed for wheezing or  shortness of breath., Disp: 8 g, Rfl: 2  EXAM:  VITALS per patient if applicable:  GENERAL: alert, oriented, appears well and in no acute distress  HEENT: atraumatic, conjunttiva clear, no obvious abnormalities on inspection of external nose and ears  NECK: normal movements of the head and neck  LUNGS: on inspection no signs of respiratory distress, breathing rate appears normal, no obvious gross SOB, gasping or wheezing  CV: no obvious cyanosis  MS: moves all visible extremities without noticeable abnormality  PSYCH/NEURO: pleasant and cooperative, no obvious depression or anxiety, speech and thought processing grossly intact  ASSESSMENT AND PLAN:  Discussed the following assessment and plan:  COVID-19  Asthma, unspecified asthma severity, unspecified whether complicated, unspecified whether persistent  -we discussed possible serious and likely etiologies, options for evaluation and workup, limitations of telemedicine visit vs in person visit, treatment, treatment risks and precautions. Pt prefers to treat via telemedicine empirically rather than in person at this moment.  Discussed treatment options, ideal treatment window, potential complications, isolation and precautions for COVID-19.  We also discussed subacute and long covid - though she is not in that phase at this point. She is out of the treatment window for EUA drugs at this point. She has needed prednisone for her asthma in the past and given she has been using alb 3 x per day and she feels it sometimes isn't helping for wheezy she opted to try prednisone. We talked about doing an abx, but this is likely viral and she prefers to avoid if possible.   Other symptomatic care measures summarized in patient instructions. Refilled her  alb per request. Work/School slipped offered: provided in patient instructions  Scheduled follow up with PCP offered: agrees to follow up as needed.  Advised low threshhold to seek prompt in person  care if worsening, new symptoms arise, or if is not improving with treatment. Discussed options for inperson care if PCP office not available. Did let this patient know that I only do telemedicine on Tuesdays and Thursdays for [place]. Advised to schedule follow up visit with PCP or [facility] if any further questions or concerns to avoid delays in care.   I discussed the assessment and treatment plan with the patient. The patient was provided an opportunity to ask questions and all were answered. The patient agreed with the plan and demonstrated an understanding of the instructions.     Terressa Koyanagi, DO

## 2022-07-12 ENCOUNTER — Other Ambulatory Visit: Payer: Self-pay | Admitting: Family Medicine

## 2022-09-08 ENCOUNTER — Other Ambulatory Visit (HOSPITAL_COMMUNITY): Payer: Self-pay | Admitting: Radiology

## 2022-09-08 DIAGNOSIS — J45909 Unspecified asthma, uncomplicated: Secondary | ICD-10-CM

## 2022-09-21 ENCOUNTER — Inpatient Hospital Stay (HOSPITAL_COMMUNITY): Admission: RE | Admit: 2022-09-21 | Payer: Medicaid Other | Source: Ambulatory Visit

## 2022-09-24 ENCOUNTER — Other Ambulatory Visit (HOSPITAL_COMMUNITY): Payer: Self-pay | Admitting: Radiology

## 2022-09-24 ENCOUNTER — Ambulatory Visit (HOSPITAL_COMMUNITY)
Admission: RE | Admit: 2022-09-24 | Discharge: 2022-09-24 | Disposition: A | Discharge status: Home / Self Care | Payer: Medicaid Other | Source: Ambulatory Visit | Attending: Internal Medicine | Admitting: Internal Medicine

## 2022-09-24 DIAGNOSIS — J45909 Unspecified asthma, uncomplicated: Secondary | ICD-10-CM | POA: Diagnosis present

## 2022-09-24 LAB — PULMONARY FUNCTION TEST
DL/VA % pred: 109 %
DL/VA: 4.94 ml/min/mmHg/L
DLCO unc % pred: 98 %
DLCO unc: 22.68 ml/min/mmHg
FEF 25-75 Post: 2.47 L/sec
FEF 25-75 Pre: 1.62 L/sec
FEF2575-%Change-Post: 52 %
FEF2575-%Pred-Post: 72 %
FEF2575-%Pred-Pre: 47 %
FEV1-%Change-Post: 13 %
FEV1-%Pred-Post: 87 %
FEV1-%Pred-Pre: 77 %
FEV1-Post: 2.83 L
FEV1-Pre: 2.5 L
FEV1FVC-%Change-Post: 6 %
FEV1FVC-%Pred-Pre: 83 %
FEV6-%Change-Post: 6 %
FEV6-%Pred-Post: 97 %
FEV6-%Pred-Pre: 91 %
FEV6-Post: 3.73 L
FEV6-Pre: 3.52 L
FEV6FVC-%Change-Post: 0 %
FEV6FVC-%Pred-Post: 100 %
FEV6FVC-%Pred-Pre: 100 %
FVC-%Change-Post: 6 %
FVC-%Pred-Post: 97 %
FVC-%Pred-Pre: 91 %
FVC-Post: 3.78 L
FVC-Pre: 3.56 L
Post FEV1/FVC ratio: 75 %
Post FEV6/FVC ratio: 99 %
Pre FEV1/FVC ratio: 70 %
Pre FEV6/FVC Ratio: 99 %
RV % pred: 128 %
RV: 1.92 L
TLC % pred: 101 %
TLC: 5.26 L

## 2022-09-24 MED ORDER — ALBUTEROL SULFATE (2.5 MG/3ML) 0.083% IN NEBU
2.5000 mg | INHALATION_SOLUTION | Freq: Once | RESPIRATORY_TRACT | Status: AC
  Administered 2022-09-24: 2.5 mg via RESPIRATORY_TRACT

## 2024-01-06 ENCOUNTER — Ambulatory Visit (HOSPITAL_COMMUNITY)
Admission: EM | Admit: 2024-01-06 | Discharge: 2024-01-06 | Disposition: A | Discharge status: Home / Self Care | Payer: Self-pay | Attending: Family Medicine | Admitting: Family Medicine

## 2024-01-06 ENCOUNTER — Encounter (HOSPITAL_COMMUNITY): Payer: Self-pay

## 2024-01-06 DIAGNOSIS — S86011A Strain of right Achilles tendon, initial encounter: Secondary | ICD-10-CM

## 2024-01-06 MED ORDER — KETOROLAC TROMETHAMINE 60 MG/2ML IM SOLN
INTRAMUSCULAR | Status: AC
  Filled 2024-01-06: qty 2

## 2024-01-06 MED ORDER — KETOROLAC TROMETHAMINE 60 MG/2ML IM SOLN
60.0000 mg | Freq: Once | INTRAMUSCULAR | Status: AC
  Administered 2024-01-06: 60 mg via INTRAMUSCULAR

## 2024-01-06 NOTE — ED Triage Notes (Signed)
 Patient presenting with right leg pain onset yesterday while playing around with her friends. States she jumped up and landed on the right ankle.   Prescriptions or OTC medications tried: Yes- Ice    with little relief

## 2024-01-06 NOTE — ED Provider Notes (Signed)
 [facility]    CSN: [id number] Arrival date & time: 01/06/24  0913      History   Chief Complaint Chief Complaint  Patient presents with   Ankle Pain    HPI Terri Howard is a 33 y.o. female.   The patient presents with acute right posterior heal pain that occurred after throwing high kicks while practicing [employer] [place] with a friend yesterday. When she brought her foot down to the ground quickly she felt a pop and had immediate pain and difficulty bearing weight. She also feels a defect in the achilles tendon. Her pain is worsened with dorsiflexion and improved with rest. She denies any numbness, fevers, chills, swelling, redness or warmth.   The history is provided by the patient.  Ankle Pain Associated symptoms: no back pain and no fever     Past Medical History:  Diagnosis Date   Asthma    Chlamydia    GBS carrier 08/23/2011   Screening for HIV (human immunodeficiency virus)    Urinary tract infection, recurrent     Patient Active Problem List   Diagnosis Date Noted   SVD (spontaneous vaginal delivery) 06/30/2020   Postpartum care following vaginal delivery 9/26 06/30/2020   Bipolar 2 disorder (HCC) 01/09/2019   Suicide attempt by drug ingestion (HCC)    Depression 11/07/2014   TCA (tricyclic antidepressant) overdose of undetermined intent 11/05/2014   chronic asthma  08/23/2011    Past Surgical History:  Procedure Laterality Date   BREAST LUMPECTOMY  2000   fibroadenma    OB History     Gravida  2   Para  2   Term  2   Preterm      AB      Living  2      SAB      IAB      Ectopic      Multiple  0   Live Births  2            Home Medications    Prior to Admission medications   Medication Sig Start Date End Date Taking? Authorizing Provider  albuterol (VENTOLIN HFA) 108 (90 Base) MCG/ACT inhaler Inhale 1-2 puffs into the lungs every 6 (six) hours as needed for wheezing or shortness of breath. 02/27/21  Yes Terressa Koyanagi,  DO    Family History Family History  Problem Relation Age of Onset   Ovarian cancer Mother    Heart disease Mother    Hypertension Mother    Mental illness Mother    Alcohol abuse Mother    Depression Mother    Drug abuse Mother    Heart attack Mother    Hyperlipidemia Mother    Hypertension Father    Alcohol abuse Father    Drug abuse Father    Hypertension Maternal Aunt    Hypertension Maternal Uncle    Hypertension Maternal Grandmother    Kidney disease Maternal Grandmother    COPD Maternal Grandmother    Breast cancer Maternal Grandmother    Birth defects Maternal Grandmother    Hypertension Maternal Grandfather     Social History Social History   Tobacco Use   Smoking status: Never   Smokeless tobacco: Never  Vaping Use   Vaping status: Never Used  Substance Use Topics   Alcohol use: Not Currently    Comment: occasional   Drug use: Not Currently    Types: Marijuana     Allergies   Patient has no known  allergies.   Review of Systems Review of Systems  Constitutional:  Negative for chills and fever.  Musculoskeletal:  Negative for arthralgias and back pain.       Pain with a defect in the right achilles   Skin:  Negative for color change and wound.  Neurological:  Positive for weakness (with plantar flexion). Negative for numbness.     Physical Exam Triage Vital Signs ED Triage Vitals  Encounter Vitals Group     BP --      Systolic BP Percentile --      Diastolic BP Percentile --      Pulse --      Resp --      Temp --      Temp src --      SpO2 --      Weight 01/06/24 1021 132 lb (59.9 kg)     Height 01/06/24 1021 5\' 5"  (1.651 m)     Head Circumference --      Peak Flow --      Pain Score 01/06/24 1020 9     Pain Loc --      Pain Education --      Exclude from Growth Chart --    No data found.  Updated Vital Signs BP 106/71 (BP Location: Left Arm)   Pulse (!) 57   Temp 99 F (37.2 C) (Oral)   Resp 16   Ht 5\' 5"  (1.651 m)    Wt 59.9 kg   LMP 12/09/2023 (Exact Date)   SpO2 98%   Breastfeeding No   BMI 21.97 kg/m   Visual Acuity Right Eye Distance:   Left Eye Distance:   Bilateral Distance:    Right Eye Near:   Left Eye Near:    Bilateral Near:     Physical Exam Vitals reviewed.  Constitutional:      General: She is not in acute distress.    Appearance: Normal appearance. She is normal weight. She is not ill-appearing, toxic-appearing or diaphoretic.  Pulmonary:     Effort: Pulmonary effort is normal.  Musculoskeletal:     Comments: Right Ankle: No visible erythema or swelling. There is a palpable defect w/ TTP of the achilles tendon. No  Range of motion is limited due to pain. Strength is 5/5 in dorsiflexion, internal and external rotation but 2/5 in plantar flexion Anterior drawer negative Squeeze test negative Thompson test positive Cotton test negative Base of 5th MT NT No sign of peroneal tendon subluxations or tenderness to palpation No sensory deficits Dorsalis pedis pulse in tact, Posterior tibial pulse in tact, cap refill 2 seconds    Skin:    General: Skin is warm.     Capillary Refill: Capillary refill takes 2 to 3 seconds.     Coloration: Skin is not jaundiced.     Findings: No bruising or erythema.  Neurological:     General: No focal deficit present.     Mental Status: She is alert.     Sensory: No sensory deficit.      UC Treatments / Results  Labs (all labs ordered are listed, but only abnormal results are displayed) Labs Reviewed - No data to display  EKG   Radiology No results found.  Procedures Procedures (including critical care time)  Medications Ordered in UC Medications  ketorolac (TORADOL) injection 60 mg (60 mg Intramuscular Given 01/06/24 1102)    Initial Impression / Assessment and Plan / UC Course  I have reviewed the  triage vital signs and the nursing notes.  Pertinent labs & imaging results that were available during my care of the patient  were reviewed by me and considered in my medical decision making (see chart for details).     Right achilles tendon rupture - The pt has a palpable defect on exam w/ almost complete weakness with plantar flexion.  - Limited US shows only trace fibers attached and essentially complete rupture of the achilles tendon.  - The patient was placed into a CAM walker boot and fitted with crutches. We discussed the need for non weight bearing and follow up with orthopedics.  - The patient will follow up as soon as possible with Dr. Susa Simmonds at [facility] orthopedics.  - She will use Ice, elevation, and scheduled/staggered dosing of Tylenol and Naproxen for pain control. - She voiced understanding with the plan and agreement. All questions were answered.   Final Clinical Impressions(s) / UC Diagnoses   Final diagnoses:  Rupture of right Achilles tendon, initial encounter   Discharge Instructions   None    ED Prescriptions   None    PDMP not reviewed this encounter.   Ivor Messier, MD 01/06/24 2112

## 2024-01-28 ENCOUNTER — Ambulatory Visit: Attending: Orthopaedic Surgery | Admitting: Physical Therapy

## 2024-01-28 DIAGNOSIS — M7099 Unspecified soft tissue disorder related to use, overuse and pressure multiple sites: Secondary | ICD-10-CM | POA: Insufficient documentation

## 2024-01-28 DIAGNOSIS — M25671 Stiffness of right ankle, not elsewhere classified: Secondary | ICD-10-CM | POA: Insufficient documentation

## 2024-01-28 DIAGNOSIS — M25571 Pain in right ankle and joints of right foot: Secondary | ICD-10-CM | POA: Insufficient documentation

## 2024-01-28 NOTE — Therapy (Deleted)
 OUTPATIENT PHYSICAL THERAPY LOWER EXTREMITY EVALUATION   Patient Name: Terri Howard MRN: [id number] DOB:Sep 17, 1991, 33 y.o., female Today's Date: 01/28/2024  END OF SESSION:   Past Medical History:  Diagnosis Date   Asthma    Chlamydia    GBS carrier 08/23/2011   Screening for HIV (human immunodeficiency virus)    Urinary tract infection, recurrent    Past Surgical History:  Procedure Laterality Date   BREAST LUMPECTOMY  2000   fibroadenma   Patient Active Problem List   Diagnosis Date Noted   SVD (spontaneous vaginal delivery) 06/30/2020   Postpartum care following vaginal delivery 9/26 06/30/2020   Bipolar 2 disorder (HCC) 01/09/2019   Suicide attempt by drug ingestion (HCC)    Depression 11/07/2014   TCA (tricyclic antidepressant) overdose of undetermined intent 11/05/2014   chronic asthma  08/23/2011    PCP: Audelia Blazer  REFERRING PROVIDER: Lasandra Points MD   REFERRING DIAG: ***  THERAPY DIAG:  No diagnosis found.  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 When she brought her foot down to the ground quickly she felt a pop and had immediate pain and difficulty bearing weight. She also feels a defect in the achilles tendon PAIN:  Are you having pain? Yes: NPRS scale: *** Pain location: *** Pain description: *** Aggravating factors: *** Relieving factors: ***  PRECAUTIONS: Other: protocol : see chart  RED FLAGS: {PT Red Flags:29287}   WEIGHT BEARING RESTRICTIONS: {Yes ***/No:24003}  FALLS:  Has patient fallen in last 6 months? {fallsyesno:27318}  LIVING ENVIRONMENT: Lives with: {[facility] lives with:25569::"lives with their family"} Lives in: {Lives in:25570} Stairs: {opstairs:27293} Has following  equipment at home: {Assistive devices:23999}  OCCUPATION: ***  PLOF: {PLOF:24004}  PATIENT GOALS: ***  NEXT MD VISIT: ***  OBJECTIVE:  Note: Objective measures were completed at Evaluation unless otherwise noted.  DIAGNOSTIC FINDINGS: ***  PATIENT SURVEYS:  {rehab surveys:24030}  COGNITION: Overall cognitive status: {cognition:24006}     SENSATION: {sensation:27233}  EDEMA:  {edema:24020}  MUSCLE LENGTH: Hamstrings: Right *** deg; Left *** deg Andy Bannister test: Right *** deg; Left *** deg  POSTURE: {posture:25561}  PALPATION: ***  LOWER EXTREMITY ROM:  Active ROM Right eval Left eval  Hip flexion    Hip extension    Hip abduction    Hip adduction    Hip internal rotation    Hip external rotation    Knee flexion    Knee extension    Ankle dorsiflexion    Ankle plantarflexion    Ankle inversion    Ankle eversion     (Blank rows = not tested)  LOWER EXTREMITY MMT:  MMT Right eval Left eval  Hip flexion    Hip extension    Hip abduction    Hip adduction    Hip internal rotation    Hip external rotation    Knee flexion    Knee extension    Ankle dorsiflexion    Ankle plantarflexion    Ankle inversion    Ankle eversion     (Blank rows = not tested)  LOWER EXTREMITY SPECIAL TESTS:  {LEspecialtests:26242}  FUNCTIONAL TESTS:  {Functional tests:24029}  GAIT: Distance walked: *** Assistive device utilized: {Assistive devices:23999} Level of assistance: {Levels of assistance:24026} Comments: ***  TREATMENT DATE: [facility] Adult PT Treatment:                                                DATE: 01/28/24 Therapeutic Exercise: *** Manual Therapy: *** Neuromuscular re-ed: *** Therapeutic Activity: *** Modalities: *** Self Care: ***     PATIENT EDUCATION:  Education details: *** Person educated: {Person  educated:25204} Education method: {Education Method:25205} Education comprehension: {Education Comprehension:25206}  HOME EXERCISE PROGRAM:   ASSESSMENT:  CLINICAL IMPRESSION: Patient is a 33 y.o. female who was seen today for physical therapy evaluation and treatment for achilles reconstruction .   OBJECTIVE IMPAIRMENTS: {opptimpairments:25111}.   ACTIVITY LIMITATIONS: {activitylimitations:27494}  PARTICIPATION LIMITATIONS: {participationrestrictions:25113}  PERSONAL FACTORS: {Personal factors:25162} are also affecting patient's functional outcome.   REHAB POTENTIAL: {rehabpotential:25112}  CLINICAL DECISION MAKING: {clinical decision making:25114}  EVALUATION COMPLEXITY: {Evaluation complexity:25115}   GOALS: Goals reviewed with patient? Yes  SHORT TERM GOALS: Target date: 02/25/2024    Pt will be I with HEP  Baseline: Goal status: INITIAL  2.  Pt will be abel to  Baseline:  Goal status: INITIAL  3.  Pt will be able to  Baseline:  Goal status: INITIAL   LONG TERM GOALS: Target date: 03/24/2024    Pt will be able to Baseline:  Goal status: INITIAL  2.  Pt will demonstrate  Baseline:  Goal status: INITIAL  3.  Pt will improve  Baseline:  Goal status: INITIAL  4.  *** Baseline:  Goal status: INITIAL  5.  *** Baseline:  Goal status: INITIAL  6.  *** Baseline:  Goal status: INITIAL   PLAN:  PT FREQUENCY: 2x/week  PT DURATION: 8 weeks  PLANNED INTERVENTIONS: 97164- PT Re-evaluation, 97750- Physical Performance Testing, 97110-Therapeutic exercises, 97530- Therapeutic activity, 97112- Neuromuscular re-education, 97535- Self Care, 16109- Manual therapy, [phone]- Gait training, Patient/Family education, Balance training, Stair training, Taping, Joint mobilization, Manual lymph drainage, Cryotherapy, and Moist heat  PLAN FOR NEXT SESSION: check HEP stay within protocol:    Hetty Linhart, PT 01/28/2024, 7:45 AM

## 2024-02-02 ENCOUNTER — Encounter: Payer: Self-pay | Admitting: Physical Therapy

## 2024-02-02 ENCOUNTER — Ambulatory Visit: Admitting: Physical Therapy

## 2024-02-02 ENCOUNTER — Other Ambulatory Visit: Payer: Self-pay

## 2024-02-02 DIAGNOSIS — M25571 Pain in right ankle and joints of right foot: Secondary | ICD-10-CM | POA: Diagnosis present

## 2024-02-02 DIAGNOSIS — M7099 Unspecified soft tissue disorder related to use, overuse and pressure multiple sites: Secondary | ICD-10-CM | POA: Diagnosis present

## 2024-02-02 DIAGNOSIS — M25671 Stiffness of right ankle, not elsewhere classified: Secondary | ICD-10-CM | POA: Diagnosis present

## 2024-02-02 NOTE — Therapy (Addendum)
 OUTPATIENT PHYSICAL THERAPY LOWER EXTREMITY EVALUATION   Patient Name: Terri Howard MRN: [id number] DOB:06-Nov-1990, 33 y.o., female Today's Date: 02/02/2024  END OF SESSION:  PT End of Session - 02/02/24 0935     Visit Number 1    Number of Visits 25    Date for PT Re-Evaluation 04/26/24    PT Start Time 0830    PT Stop Time 0908    PT Time Calculation (min) 38 min             Past Medical History:  Diagnosis Date   Asthma    Chlamydia    GBS carrier 08/23/2011   Screening for HIV (human immunodeficiency virus)    Urinary tract infection, recurrent    Past Surgical History:  Procedure Laterality Date   BREAST LUMPECTOMY  2000   fibroadenma   Patient Active Problem List   Diagnosis Date Noted   SVD (spontaneous vaginal delivery) 06/30/2020   Postpartum care following vaginal delivery 9/26 06/30/2020   Bipolar 2 disorder (HCC) 01/09/2019   Suicide attempt by drug ingestion (HCC)    Depression 11/07/2014   TCA (tricyclic antidepressant) overdose of undetermined intent 11/05/2014   chronic asthma  08/23/2011    PCP: not in system  REFERRING PROVIDER: Donnamarie Gables, MD  REFERRING DIAG: S/P RIGHT ACHILLE RECONSTRUCTION SURGERY 01/13/24 ,per patient   Rationale for Evaluation and Treatment: Rehabilitation  THERAPY DIAG:  Pain in right ankle and joints of right foot  Stiffness of right ankle, not elsewhere classified  Unspecified soft tissue disorder related to use, overuse and pressure multiple sites  PERTINENT HISTORY: Asthma (uses an inhaler)  WEIGHT BEARING RESTRICTIONS: Yes no protocol currently available, likely NWB  FALLS:  Has patient fallen in last 6 months? Yes. Number of falls 2, rolled over on a toe while using crutches  LIVING ENVIRONMENT: Lives with: lives with their family Lives in: House/apartment Stairs: no stairs Has following equipment at home: Crutches, shower chair, and has an order for a knee scooter  OCCUPATION: mental  health therapist LCSW   PRECAUTIONS: None ---------------------------------------------------------------------------------------------  SUBJECTIVE:   SUBJECTIVE STATEMENT: Eval statement 02/02/2024: tore achilles on April 3rd, had an ultrasound performed at a [place] urgent care. Surgery to reconstruct on April 10th at the [facility] surgical center. They gave her a packet with protocols and rehab information in it, currently not with her, its in her work bag.  RED FLAGS: None   PLOF: Independent  PATIENT GOALS: get back to martial arts, kung fu and boxing primarily   NEXT MD VISIT: may 30th ---------------------------------------------------------------------------------------------  OBJECTIVE:  Note: Objective measures were completed at Evaluation unless otherwise noted.  PATIENT SURVEYS:  LEFS 17/80  COGNITION: Overall cognitive status: Within functional limits for tasks assessed     SENSATION: Light touch: Impaired   EDEMA:  Minimal swelling around R ankle   POSTURE: No Significant postural limitations  PALPATION: Slight tenderness around surgical site   LOWER EXTREMITY ROM:  Passive ROM Right eval Left eval  Hip flexion    Hip extension    Hip abduction    Hip adduction    Hip internal rotation    Hip external rotation    Knee flexion [facility]  Knee extension [facility]  Ankle dorsiflexion -15 WFL  Ankle plantarflexion [facility] WFL  Ankle inversion    Ankle eversion     (Blank rows = not tested)  ! Indicates pain with testing  LOWER EXTREMITY MMT: deferred d/t recent surgery  MMT  Right eval Left eval  Hip flexion    Hip extension    Hip abduction    Hip adduction    Hip internal rotation    Hip external rotation    Knee flexion    Knee extension    Ankle dorsiflexion    Ankle plantarflexion    Ankle inversion    Ankle eversion     (Blank rows = not tested)  ! Indicates pain with testing  GAIT: Distance walked: 173ft Assistive device  utilized: Crutches Level of assistance: Modified independence Comments: uses 3 point NWB pattern on RLE                                                                                                                                [facility] Adult PT Treatment:                                                DATE: 02/02/2024  Self Care: Pt education, detailed below POC discussion    PATIENT EDUCATION:  Education details: Pt received education regarding HEP performance, ADL performance, functional activity tolerance, impairment education, appropriate performance of therapeutic activities.DME use, surgical protocol Person educated: Patient Education method: Explanation, Demonstration, Tactile cues, Verbal cues, and Handouts Education comprehension: verbalized understanding and returned demonstration  HOME EXERCISE PROGRAM: Access Code: [email or web address] URL: [email or web address] Date: 02/02/2024 Prepared by: Albesa Huguenin  Exercises - Supine Ankle Circles  - 1 x daily - 7 x weekly - 2 sets - 1 reps - 67m hold - Ankle Dorsiflexion with Resistance  - 1 x daily - 7 x weekly - 2-3 sets - 10-15 reps - 2s hold - Ankle Eversion with Resistance  - 1 x daily - 7 x weekly - 2-3 sets - 10-15 reps - 2s hold - Ankle Inversion with Resistance  - 1 x daily - 7 x weekly - 2-3 sets - 10-15 reps - 2s hold - Ankle and Toe Plantarflexion with Resistance  - 1 x daily - 7 x weekly - 2-3 sets - 10-15 reps - 2s hold ---------------------------------------------------------------------------------------------  ASSESSMENT:  CLINICAL IMPRESSION: Eval impression (02/02/2024): Pt. attended today's physical therapy session for evaluation of RLE achilles repair. Pt has complaints of 1/10 pain at rest, difficulties getting around and reduced community participation in martial arts. Pt has notable deficits with ankle ROM, strength due to immobility, and hypersensitivity surrounding surgical site. Pt would benefit  from therapeutic focus on ankle A/PROM, ankle strengthening, gait and activity tolerance as allowed within surgical protocol. No protocol is currently attainable at eval, pt stated that she has a paper packet she will bring to next session with all of that information.  Treatment performed today focused on pt education detailed in obj. Pt demonstrated great understanding of education provided. required minimal verbal/tactile cues and no physical  assistance for appropriate performance with today's activities. Pt requires the intervention of skilled outpatient physical therapy to address the aforementioned deficits and progress towards a functional level in line with therapeutic goals.   OBJECTIVE IMPAIRMENTS: Abnormal gait, decreased activity tolerance, decreased balance, decreased coordination, decreased mobility, difficulty walking, decreased ROM, decreased strength, hypomobility, increased edema, impaired flexibility, and pain.   ACTIVITY LIMITATIONS: carrying, standing, squatting, stairs, transfers, toileting, self feeding, locomotion level, and caring for others  PARTICIPATION LIMITATIONS: meal prep, cleaning, driving, shopping, community activity, and occupation  PERSONAL FACTORS: Behavior pattern, Time since onset of injury/illness/exacerbation, and Transportation are also affecting patient's functional outcome.   REHAB POTENTIAL: Good  CLINICAL DECISION MAKING: Stable/uncomplicated  EVALUATION COMPLEXITY: Low   GOALS: Goals reviewed with patient? YES  SHORT TERM GOALS: Target date: 03/15/2024  Pt will be independent with administered HEP to demonstrate the competency necessary for long term managemnet of symptoms at home.  Baseline: Goal status: INITIAL  2.  Pt will be able to WB at least 90% with CAM boot through RLE to demonstrate improved WB ability necessary for household/community ambulation Baseline:  Goal status: INITIAL   LONG TERM GOALS: Target date: 04/26/2024  Pt.  Will achieve a LEFS score of 60/80 as to demonstrate improvement in self-perceived functional ability with daily activities.  Baseline: 17/80 Goal status: INITIAL  2.  Pt will improve Global ankle/foot strength to a 4+/5 to demonstrate improvement in strength for quality of motion and activity performance.  Baseline:  Goal status: INITIAL  3.   Pt will independently ambulate 105ft with LRAD, no CAM boot, no LOB, and functional ROM to allow for heel-toe gait pattern as to demonstrate improved weightbearing tolerance, BLE strength, and functional capacity for community ambulation.  Baseline: NWB in CAM boot with B crutches Goal status: INITIAL  4.  Pt will report the ability to stand for >/= 60 minutes as to demonstrate improved tolerance to standing for prolonged time and improved ability to participate in martial arts practice and take care of children.  Baseline:  Goal status: INITIAL  5.  Pt will report pain levels improving during ADLs to be less than or equal to 2/10 as to demonstrate improved tolerance with daily functional activities such as care taking and cleaning the house. Baseline:  Goal status: INITIAL   --------------------------------------------------------------------------------------------- PLAN:  PT FREQUENCY: 1-2x/week  PT DURATION: 12 weeks  PLANNED INTERVENTIONS: 97110-Therapeutic exercises, 97530- Therapeutic activity, 97112- Neuromuscular re-education, 97535- Self Care, 62952- Manual therapy, [phone]- Gait training, [phone]- Electrical stimulation (manual), [phone]- Vasopneumatic device, Patient/Family education, Balance training, Stair training, Taping, Dry Needling, and Joint mobilization  PLAN FOR NEXT SESSION: Review HEP, Begin POC as detailed in the assessment   Albesa Huguenin, PT, DPT 02/02/2024, 9:38 AM   For all possible CPT codes, reference the Planned Interventions line above.     Check all conditions that are expected to impact treatment:  {Conditions expected to impact treatment:None of these apply   If treatment provided at initial evaluation, no treatment charged due to lack of authorization.

## 2024-02-09 ENCOUNTER — Encounter: Payer: Self-pay | Admitting: Physical Therapy

## 2024-02-09 ENCOUNTER — Ambulatory Visit (HOSPITAL_COMMUNITY)

## 2024-02-10 ENCOUNTER — Ambulatory Visit: Admitting: Physical Therapy

## 2024-02-15 ENCOUNTER — Encounter: Payer: Self-pay | Admitting: Physical Therapy

## 2024-02-15 ENCOUNTER — Ambulatory Visit: Attending: Orthopaedic Surgery | Admitting: Physical Therapy

## 2024-02-15 DIAGNOSIS — M25671 Stiffness of right ankle, not elsewhere classified: Secondary | ICD-10-CM | POA: Insufficient documentation

## 2024-02-15 DIAGNOSIS — M25571 Pain in right ankle and joints of right foot: Secondary | ICD-10-CM | POA: Insufficient documentation

## 2024-02-15 DIAGNOSIS — M7099 Unspecified soft tissue disorder related to use, overuse and pressure multiple sites: Secondary | ICD-10-CM | POA: Insufficient documentation

## 2024-02-15 NOTE — Therapy (Signed)
 OUTPATIENT PHYSICAL THERAPY TREATMENT NOTE   Patient Name: Terri Howard MRN: [id number] DOB:10-01-1991, 33 y.o., female Today's Date: 02/15/2024  END OF SESSION:  PT End of Session - 02/15/24 0921     Visit Number 2    Number of Visits 25    Date for PT Re-Evaluation 04/26/24    PT Start Time 0921    PT Stop Time 0959    PT Time Calculation (min) 38 min              Past Medical History:  Diagnosis Date   Asthma    Chlamydia    GBS carrier 08/23/2011   Screening for HIV (human immunodeficiency virus)    Urinary tract infection, recurrent    Past Surgical History:  Procedure Laterality Date   BREAST LUMPECTOMY  2000   fibroadenma   Patient Active Problem List   Diagnosis Date Noted   SVD (spontaneous vaginal delivery) 06/30/2020   Postpartum care following vaginal delivery 9/26 06/30/2020   Bipolar 2 disorder (HCC) 01/09/2019   Suicide attempt by drug ingestion (HCC)    Depression 11/07/2014   TCA (tricyclic antidepressant) overdose of undetermined intent 11/05/2014   chronic asthma  08/23/2011    PCP: not in system  REFERRING PROVIDER: Donnamarie Gables, MD  REFERRING DIAG: S/P RIGHT ACHILLE RECONSTRUCTION SURGERY 01/13/24 ,per patient   Rationale for Evaluation and Treatment: Rehabilitation  THERAPY DIAG:  Pain in right ankle and joints of right foot  Stiffness of right ankle, not elsewhere classified  Unspecified soft tissue disorder related to use, overuse and pressure multiple sites  PERTINENT HISTORY: Asthma (uses an inhaler)  WEIGHT BEARING RESTRICTIONS: Yes no protocol currently available, likely NWB  FALLS:  Has patient fallen in last 6 months? Yes. Number of falls 2, rolled over on a toe while using crutches  LIVING ENVIRONMENT: Lives with: lives with their family Lives in: House/apartment Stairs: no stairs Has following equipment at home: Crutches, shower chair, and has an order for a knee scooter  OCCUPATION: mental health  therapist LCSW   PRECAUTIONS: None ---------------------------------------------------------------------------------------------  SUBJECTIVE:   SUBJECTIVE STATEMENT: Pt attended today's session with reports of 6/10 pain. Pt stated that they have maintained good compliance with current HEP.  Swelling is on and off, was told    Eval statement 02/02/2024: tore achilles on April 3rd, had an ultrasound performed at a [place] urgent care. Surgery to reconstruct on April 10th at the [facility] surgical center. They gave her a packet with protocols and rehab information in it, currently not with her, its in her work bag.  RED FLAGS: None   PLOF: Independent  PATIENT GOALS: get back to martial arts, kung fu and boxing primarily   NEXT MD VISIT: may 30th ---------------------------------------------------------------------------------------------  OBJECTIVE:  Note: Objective measures were completed at Evaluation unless otherwise noted.  PATIENT SURVEYS:  LEFS 17/80  COGNITION: Overall cognitive status: Within functional limits for tasks assessed     SENSATION: Light touch: Impaired   EDEMA:  Minimal swelling around R ankle   POSTURE: No Significant postural limitations  PALPATION: Slight tenderness around surgical site   LOWER EXTREMITY ROM:  Passive ROM Right eval Left eval  Hip flexion    Hip extension    Hip abduction    Hip adduction    Hip internal rotation    Hip external rotation    Knee flexion [facility]  Knee extension [facility]  Ankle dorsiflexion -15 WFL  Ankle plantarflexion [facility]  Ankle  inversion    Ankle eversion     (Blank rows = not tested)  ! Indicates pain with testing  LOWER EXTREMITY MMT: deferred d/t recent surgery  MMT Right eval Left eval  Hip flexion    Hip extension    Hip abduction    Hip adduction    Hip internal rotation    Hip external rotation    Knee flexion    Knee extension    Ankle dorsiflexion    Ankle  plantarflexion    Ankle inversion    Ankle eversion     (Blank rows = not tested)  ! Indicates pain with testing  GAIT: Distance walked: 190ft Assistive device utilized: Crutches Level of assistance: Modified independence Comments: uses 3 point NWB pattern on RLE   [facility] Adult PT Treatment:                                                DATE: 02/15/2024  Therapeutic Exercise: Triplanar AROM of R foot Long sitting gastroc stretch with strap 2x2' Long sitting soelus stretch with strap 2x2'  Therapeutic Activity: Standing weight shifts on RLE w/CAM boot 2x10, hold 4s at 25% body weight (~40lbs) Phase II protocol review Education on anatomy, prognosis, and activity modification at home                                                                                                                                [facility] Adult PT Treatment:                                                DATE: 02/02/2024  Self Care: Pt education, detailed below POC discussion    PATIENT EDUCATION:  Education details: Pt received education regarding HEP performance, ADL performance, functional activity tolerance, impairment education, appropriate performance of therapeutic activities.DME use, surgical protocol Person educated: Patient Education method: Explanation, Demonstration, Tactile cues, Verbal cues, and Handouts Education comprehension: verbalized understanding and returned demonstration  HOME EXERCISE PROGRAM: Access Code: [email or web address] URL: [email or web address] Date: 02/15/2024 Prepared by: Albesa Huguenin  Exercises - Supine Ankle Circles  - 1 x daily - 7 x weekly - 2 sets - 1 reps - 70m hold - Ankle Dorsiflexion with Resistance  - 1 x daily - 7 x weekly - 2-3 sets - 10-15 reps - 2s hold - Ankle Eversion with Resistance  - 1 x daily - 7 x weekly - 2-3 sets - 10-15 reps - 2s hold - Ankle Inversion with Resistance  - 1 x daily - 7 x weekly - 2-3 sets - 10-15 reps - 2s hold -  Ankle and Toe Plantarflexion with Resistance  - 1 x daily -  7 x weekly - 2-3 sets - 10-15 reps - 2s hold - Long Sitting Calf Stretch with Strap  - 1 x daily - 7 x weekly - 2 sets - 1 reps - 2' hold ---------------------------------------------------------------------------------------------  ASSESSMENT:  CLINICAL IMPRESSION: Pt attended physical therapy session for continuation of treatment regarding RLE achilles repair. Today's treatment focused on improvement of  available A/PROM, education surrounding current surgical protocol phase, and WB tolerance w/CAM boot (up to 25%). Pt showed  great tolerance to administered treatment with no adverse effects by the end of session. Skilled intervention was utilized via activity modification for pt tolerance with task completion, functional progression/regression promoting best outcomes inline with current rehab goals, as well as minimal verbal/tactile cuing alongside no physical assistance for safe and appropriate performance of today's activities. Pt reported no pain with WB activities (up to ~40lbs of pressure today) Continue to progress within current surgical protocol. (Check my top desk drawer for protocol sheet)   Eval impression (02/02/2024): Pt. attended today's physical therapy session for evaluation of RLE achilles repair. Pt has complaints of 1/10 pain at rest, difficulties getting around and reduced community participation in martial arts. Pt has notable deficits with ankle ROM, strength due to immobility, and hypersensitivity surrounding surgical site. Pt would benefit from therapeutic focus on ankle A/PROM, ankle strengthening, gait and activity tolerance as allowed within surgical protocol. No protocol is currently attainable at eval, pt stated that she has a paper packet she will bring to next session with all of that information.  Treatment performed today focused on pt education detailed in obj. Pt demonstrated great understanding of education  provided. required minimal verbal/tactile cues and no physical assistance for appropriate performance with today's activities. Pt requires the intervention of skilled outpatient physical therapy to address the aforementioned deficits and progress towards a functional level in line with therapeutic goals.   OBJECTIVE IMPAIRMENTS: Abnormal gait, decreased activity tolerance, decreased balance, decreased coordination, decreased mobility, difficulty walking, decreased ROM, decreased strength, hypomobility, increased edema, impaired flexibility, and pain.   ACTIVITY LIMITATIONS: carrying, standing, squatting, stairs, transfers, toileting, self feeding, locomotion level, and caring for others  PARTICIPATION LIMITATIONS: meal prep, cleaning, driving, shopping, community activity, and occupation  PERSONAL FACTORS: Behavior pattern, Time since onset of injury/illness/exacerbation, and Transportation are also affecting patient's functional outcome.   REHAB POTENTIAL: Good  CLINICAL DECISION MAKING: Stable/uncomplicated  EVALUATION COMPLEXITY: Low   GOALS: Goals reviewed with patient? YES  SHORT TERM GOALS: Target date: 03/15/2024  Pt will be independent with administered HEP to demonstrate the competency necessary for long term managemnet of symptoms at home.  Baseline: Goal status: INITIAL  2.  Pt will be able to WB at least 90% with CAM boot through RLE to demonstrate improved WB ability necessary for household/community ambulation Baseline:  Goal status: INITIAL   LONG TERM GOALS: Target date: 04/26/2024  Pt. Will achieve a LEFS score of 60/80 as to demonstrate improvement in self-perceived functional ability with daily activities.  Baseline: 17/80 Goal status: INITIAL  2.  Pt will improve Global ankle/foot strength to a 4+/5 to demonstrate improvement in strength for quality of motion and activity performance.  Baseline:  Goal status: INITIAL  3.   Pt will independently ambulate  1033ft with LRAD, no CAM boot, no LOB, and functional ROM to allow for heel-toe gait pattern as to demonstrate improved weightbearing tolerance, BLE strength, and functional capacity for community ambulation.  Baseline: NWB in CAM boot with B crutches Goal status: INITIAL  4.  Pt will report the ability to stand for >/= 60 minutes as to demonstrate improved tolerance to standing for prolonged time and improved ability to participate in martial arts practice and take care of children.  Baseline:  Goal status: INITIAL  5.  Pt will report pain levels improving during ADLs to be less than or equal to 2/10 as to demonstrate improved tolerance with daily functional activities such as care taking and cleaning the house. Baseline:  Goal status: INITIAL   --------------------------------------------------------------------------------------------- PLAN:  PT FREQUENCY: 1-2x/week  PT DURATION: 12 weeks  PLANNED INTERVENTIONS: 97110-Therapeutic exercises, 97530- Therapeutic activity, [id number]- Neuromuscular re-education, 97535- Self Care, 16109- Manual therapy, [phone]- Gait training, [phone]- Electrical stimulation (manual), [id number]- Vasopneumatic device, Patient/Family education, Balance training, Stair training, Taping, Dry Needling, and Joint mobilization  PLAN FOR NEXT SESSION: Continue to progress within current surgical protocol. (Check my top desk drawer for protocol sheet)   Albesa Huguenin, PT, DPT 02/15/2024, 9:59 AM   For all possible CPT codes, reference the Planned Interventions line above.     Check all conditions that are expected to impact treatment: {Conditions expected to impact treatment:None of these apply   If treatment provided at initial evaluation, no treatment charged due to lack of authorization.

## 2024-02-16 NOTE — Therapy (Unsigned)
 OUTPATIENT PHYSICAL THERAPY TREATMENT NOTE   Patient Name: Terri Howard MRN: [id number] DOB:July 06, 1991, 33 y.o., female Today's Date: 02/18/2024  END OF SESSION:  PT End of Session - 02/18/24 1005     Visit Number 3    Number of Visits 25    Date for PT Re-Evaluation 04/26/24    Authorization Type [place] [facility]    Authorization Time Period Approved 6 visits 02/01/24-04/01/24    Authorization - Visit Number 3    Authorization - Number of Visits 6    PT Start Time 1005    PT Stop Time 1045    PT Time Calculation (min) 40 min    Activity Tolerance Patient tolerated treatment well    Behavior During Therapy [facility] for tasks assessed/performed               Past Medical History:  Diagnosis Date   Asthma    Chlamydia    GBS carrier 08/23/2011   Screening for HIV (human immunodeficiency virus)    Urinary tract infection, recurrent    Past Surgical History:  Procedure Laterality Date   BREAST LUMPECTOMY  2000   fibroadenma   Patient Active Problem List   Diagnosis Date Noted   SVD (spontaneous vaginal delivery) 06/30/2020   Postpartum care following vaginal delivery 9/26 06/30/2020   Bipolar 2 disorder (HCC) 01/09/2019   Suicide attempt by drug ingestion (HCC)    Depression 11/07/2014   TCA (tricyclic antidepressant) overdose of undetermined intent 11/05/2014   chronic asthma  08/23/2011    PCP: not in system  REFERRING PROVIDER: Donnamarie Gables, MD  REFERRING DIAG: S/P RIGHT ACHILLE RECONSTRUCTION SURGERY 01/13/24 ,per patient   Rationale for Evaluation and Treatment: Rehabilitation  THERAPY DIAG:  Pain in right ankle and joints of right foot  Unspecified soft tissue disorder related to use, overuse and pressure multiple sites  Stiffness of right ankle, not elsewhere classified  PERTINENT HISTORY: Asthma (uses an inhaler)  WEIGHT BEARING RESTRICTIONS: Yes no protocol currently available, likely NWB  FALLS:  Has patient fallen in last 6 months? Yes.  Number of falls 2, rolled over on a toe while using crutches  LIVING ENVIRONMENT: Lives with: lives with their family Lives in: House/apartment Stairs: no stairs Has following equipment at home: Crutches, shower chair, and has an order for a knee scooter  OCCUPATION: mental health therapist LCSW   PRECAUTIONS: None ---------------------------------------------------------------------------------------------  SUBJECTIVE:   SUBJECTIVE STATEMENT:  Symptom intensity down to 3/10   Eval statement 02/02/2024: tore achilles on April 3rd, had an ultrasound performed at a [place] urgent care. Surgery to reconstruct on April 10th at the [facility]. They gave her a packet with protocols and rehab information in it, currently not with her, its in her work bag.  RED FLAGS: None   PLOF: Independent  PATIENT GOALS: get back to martial arts, kung fu and boxing primarily   NEXT MD VISIT: may 30th ---------------------------------------------------------------------------------------------  OBJECTIVE:  Note: Objective measures were completed at Evaluation unless otherwise noted.  PATIENT SURVEYS:  LEFS 17/80  COGNITION: Overall cognitive status: Within functional limits for tasks assessed     SENSATION: Light touch: Impaired   EDEMA:  Minimal swelling around R ankle   POSTURE: No Significant postural limitations  PALPATION: Slight tenderness around surgical site   LOWER EXTREMITY ROM:  Passive ROM Right eval Left eval  Hip flexion    Hip extension    Hip abduction    Hip adduction    Hip internal  rotation    Hip external rotation    Knee flexion [facility]  Knee extension [facility]  Ankle dorsiflexion -15 WFL  Ankle plantarflexion [facility]  Ankle inversion    Ankle eversion     (Blank rows = not tested)  ! Indicates pain with testing  LOWER EXTREMITY MMT: deferred d/t recent surgery  MMT Right eval Left eval  Hip flexion    Hip extension    Hip  abduction    Hip adduction    Hip internal rotation    Hip external rotation    Knee flexion    Knee extension    Ankle dorsiflexion    Ankle plantarflexion    Ankle inversion    Ankle eversion     (Blank rows = not tested)  ! Indicates pain with testing  GAIT: Distance walked: 119ft Assistive device utilized: Crutches Level of assistance: Modified independence Comments: uses 3 point NWB pattern on RLE  [facility] Adult PT Treatment:                                                DATE: 02/18/24 Therapeutic Exercise: PF stretch with towel 60s x2 Nustep L4 8 min Manual Therapy: Subtalar mobs 5x10 all directions Therapeutic Activity: Toe taps 60s seated Heel taps 60s seated Inversion/eversion over towel 60s  [facility] Adult PT Treatment:                                                DATE: 02/15/2024  Therapeutic Exercise: Triplanar AROM of R foot Long sitting gastroc stretch with strap 2x2' Long sitting soelus stretch with strap 2x2'  Therapeutic Activity: Standing weight shifts on RLE w/CAM boot 2x10, hold 4s at 25% body weight (~40lbs) Phase II protocol review Education on anatomy, prognosis, and activity modification at home                                                                                                                                [facility] Adult PT Treatment:                                                DATE: 02/02/2024  Self Care: Pt education, detailed below POC discussion    PATIENT EDUCATION:  Education details: Pt received education regarding HEP performance, ADL performance, functional activity tolerance, impairment education, appropriate performance of therapeutic activities.DME use, surgical protocol Person educated: Patient Education method: Explanation, Demonstration, Tactile cues, Verbal cues, and Handouts Education comprehension: verbalized understanding and returned demonstration  HOME EXERCISE PROGRAM:  Access Code: [email or web address] URL:  [email or web address] Date: 02/15/2024 Prepared by: Albesa Huguenin  Exercises - Supine Ankle Circles  - 1 x daily - 7 x weekly - 2 sets - 1 reps - 78m hold - Ankle Dorsiflexion with Resistance  - 1 x daily - 7 x weekly - 2-3 sets - 10-15 reps - 2s hold - Ankle Eversion with Resistance  - 1 x daily - 7 x weekly - 2-3 sets - 10-15 reps - 2s hold - Ankle Inversion with Resistance  - 1 x daily - 7 x weekly - 2-3 sets - 10-15 reps - 2s hold - Ankle and Toe Plantarflexion with Resistance  - 1 x daily - 7 x weekly - 2-3 sets - 10-15 reps - 2s hold - Long Sitting Calf Stretch with Strap  - 1 x daily - 7 x weekly - 2 sets - 1 reps - 2' hold ---------------------------------------------------------------------------------------------  ASSESSMENT:  CLINICAL IMPRESSION: Focus of today was subtalar mobs followed by stretching.  Noted relief of symptoms following mobs and found stretching and AROM.   Introduced AROM as well as aerobic work for peripheral circulation.  Able to tolerate all tasks w/o exacerbation of symptoms.   Pt attended physical therapy session for continuation of treatment regarding RLE achilles repair. Today's treatment focused on improvement of  available A/PROM, education surrounding current surgical protocol phase, and WB tolerance w/CAM boot (up to 25%). Pt showed  great tolerance to administered treatment with no adverse effects by the end of session. Skilled intervention was utilized via activity modification for pt tolerance with task completion, functional progression/regression promoting best outcomes inline with current rehab goals, as well as minimal verbal/tactile cuing alongside no physical assistance for safe and appropriate performance of today's activities. Pt reported no pain with WB activities (up to ~40lbs of pressure today) Continue to progress within current surgical protocol. (Check my top desk drawer for protocol sheet)   Eval impression (02/02/2024): Pt.  attended today's physical therapy session for evaluation of RLE achilles repair. Pt has complaints of 1/10 pain at rest, difficulties getting around and reduced community participation in martial arts. Pt has notable deficits with ankle ROM, strength due to immobility, and hypersensitivity surrounding surgical site. Pt would benefit from therapeutic focus on ankle A/PROM, ankle strengthening, gait and activity tolerance as allowed within surgical protocol. No protocol is currently attainable at eval, pt stated that she has a paper packet she will bring to next session with all of that information.  Treatment performed today focused on pt education detailed in obj. Pt demonstrated great understanding of education provided. required minimal verbal/tactile cues and no physical assistance for appropriate performance with today's activities. Pt requires the intervention of skilled outpatient physical therapy to address the aforementioned deficits and progress towards a functional level in line with therapeutic goals.   OBJECTIVE IMPAIRMENTS: Abnormal gait, decreased activity tolerance, decreased balance, decreased coordination, decreased mobility, difficulty walking, decreased ROM, decreased strength, hypomobility, increased edema, impaired flexibility, and pain.   ACTIVITY LIMITATIONS: carrying, standing, squatting, stairs, transfers, toileting, self feeding, locomotion level, and caring for others  PARTICIPATION LIMITATIONS: meal prep, cleaning, driving, shopping, community activity, and occupation  PERSONAL FACTORS: Behavior pattern, Time since onset of injury/illness/exacerbation, and Transportation are also affecting patient's functional outcome.   REHAB POTENTIAL: Good  CLINICAL DECISION MAKING: Stable/uncomplicated  EVALUATION COMPLEXITY: Low   GOALS: Goals reviewed with patient? YES  SHORT TERM GOALS: Target date: 03/15/2024  Pt will be independent with administered HEP to demonstrate the  competency necessary for long term managemnet of symptoms at home.  Baseline: Goal status: INITIAL  2.  Pt will be able to WB at least 90% with CAM boot through RLE to demonstrate improved WB ability necessary for household/community ambulation Baseline:  Goal status: INITIAL   LONG TERM GOALS: Target date: 04/26/2024  Pt. Will achieve a LEFS score of 60/80 as to demonstrate improvement in self-perceived functional ability with daily activities.  Baseline: 17/80 Goal status: INITIAL  2.  Pt will improve Global ankle/foot strength to a 4+/5 to demonstrate improvement in strength for quality of motion and activity performance.  Baseline:  Goal status: INITIAL  3.   Pt will independently ambulate 1016ft with LRAD, no CAM boot, no LOB, and functional ROM to allow for heel-toe gait pattern as to demonstrate improved weightbearing tolerance, BLE strength, and functional capacity for community ambulation.  Baseline: NWB in CAM boot with B crutches Goal status: INITIAL  4.  Pt will report the ability to stand for >/= 60 minutes as to demonstrate improved tolerance to standing for prolonged time and improved ability to participate in martial arts practice and take care of children.  Baseline:  Goal status: INITIAL  5.  Pt will report pain levels improving during ADLs to be less than or equal to 2/10 as to demonstrate improved tolerance with daily functional activities such as care taking and cleaning the house. Baseline:  Goal status: INITIAL   --------------------------------------------------------------------------------------------- PLAN:  PT FREQUENCY: 1-2x/week  PT DURATION: 12 weeks  PLANNED INTERVENTIONS: 97110-Therapeutic exercises, 97530- Therapeutic activity, [id number]- Neuromuscular re-education, 97535- Self Care, 40981- Manual therapy, [phone]- Gait training, [phone]- Electrical stimulation (manual), 97016- Vasopneumatic device, Patient/Family education, Balance training, Stair  training, Taping, Dry Needling, and Joint mobilization  PLAN FOR NEXT SESSION: Continue to progress within current surgical protocol. (Check my top desk drawer for protocol sheet)   Jeff Marketa Midkiff PT  02/18/2024, 10:41 AM   For all possible CPT codes, reference the Planned Interventions line above.     Check all conditions that are expected to impact treatment: {Conditions expected to impact treatment:None of these apply   If treatment provided at initial evaluation, no treatment charged due to lack of authorization.

## 2024-02-18 ENCOUNTER — Ambulatory Visit

## 2024-02-18 DIAGNOSIS — M25571 Pain in right ankle and joints of right foot: Secondary | ICD-10-CM | POA: Diagnosis not present

## 2024-02-18 DIAGNOSIS — M7099 Unspecified soft tissue disorder related to use, overuse and pressure multiple sites: Secondary | ICD-10-CM

## 2024-02-18 DIAGNOSIS — M25671 Stiffness of right ankle, not elsewhere classified: Secondary | ICD-10-CM

## 2024-02-21 ENCOUNTER — Ambulatory Visit: Admitting: Physical Therapy

## 2024-02-23 ENCOUNTER — Encounter: Payer: Self-pay | Admitting: Physical Therapy

## 2024-02-23 ENCOUNTER — Ambulatory Visit: Admitting: Physical Therapy

## 2024-02-23 DIAGNOSIS — M25671 Stiffness of right ankle, not elsewhere classified: Secondary | ICD-10-CM

## 2024-02-23 DIAGNOSIS — M7099 Unspecified soft tissue disorder related to use, overuse and pressure multiple sites: Secondary | ICD-10-CM

## 2024-02-23 DIAGNOSIS — M25571 Pain in right ankle and joints of right foot: Secondary | ICD-10-CM

## 2024-02-23 NOTE — Therapy (Signed)
 OUTPATIENT PHYSICAL THERAPY TREATMENT NOTE   Patient Name: Terri Howard MRN: [id number] DOB:1991-07-14, 33 y.o., female Today's Date: 02/23/2024  END OF SESSION:  PT End of Session - 02/23/24 0829     Visit Number 4    Number of Visits 25    Date for PT Re-Evaluation 04/26/24    Authorization Type [place] [employer]    Authorization Time Period Approved 6 visits 02/01/24-04/01/24    Authorization - Visit Number 4    Authorization - Number of Visits 6    PT Start Time 0830    PT Stop Time 0910    PT Time Calculation (min) 40 min    Activity Tolerance Patient tolerated treatment well    Behavior During Therapy [facility] for tasks assessed/performed                Past Medical History:  Diagnosis Date   Asthma    Chlamydia    GBS carrier 08/23/2011   Screening for HIV (human immunodeficiency virus)    Urinary tract infection, recurrent    Past Surgical History:  Procedure Laterality Date   BREAST LUMPECTOMY  2000   fibroadenma   Patient Active Problem List   Diagnosis Date Noted   SVD (spontaneous vaginal delivery) 06/30/2020   Postpartum care following vaginal delivery 9/26 06/30/2020   Bipolar 2 disorder (HCC) 01/09/2019   Suicide attempt by drug ingestion (HCC)    Depression 11/07/2014   TCA (tricyclic antidepressant) overdose of undetermined intent 11/05/2014   chronic asthma  08/23/2011    PCP: not in system  REFERRING PROVIDER: Donnamarie Gables, MD  REFERRING DIAG: S/P RIGHT ACHILLE RECONSTRUCTION SURGERY 01/13/24 ,per patient   Rationale for Evaluation and Treatment: Rehabilitation  THERAPY DIAG:  Pain in right ankle and joints of right foot  Stiffness of right ankle, not elsewhere classified  Unspecified soft tissue disorder related to use, overuse and pressure multiple sites  PERTINENT HISTORY: Asthma (uses an inhaler)  WEIGHT BEARING RESTRICTIONS: Progressive WBAT   FALLS:  Has patient fallen in last 6 months? Yes. Number of falls 2, rolled over  on a toe while using crutches  LIVING ENVIRONMENT: Lives with: lives with their family Lives in: House/apartment Stairs: no stairs Has following equipment at home: Crutches, shower chair, and has an order for a knee scooter  OCCUPATION: mental health therapist LCSW   PRECAUTIONS: None ---------------------------------------------------------------------------------------------  SUBJECTIVE:   SUBJECTIVE STATEMENT:  Pt attended today's session with reports of 2/10 pain. Pt stated that they have maintained good compliance with current HEP.  Hasn't taken any wedges out yet because she's scared    Eval statement 02/02/2024: tore achilles on April 3rd, had an ultrasound performed at a [place] urgent care. Surgery to reconstruct on April 10th at the [facility] surgical center. They gave her a packet with protocols and rehab information in it, currently not with her, its in her work bag.  RED FLAGS: None   PLOF: Independent  PATIENT GOALS: get back to martial arts, kung fu and boxing primarily   NEXT MD VISIT: may 30th ---------------------------------------------------------------------------------------------  OBJECTIVE:  Note: Objective measures were completed at Evaluation unless otherwise noted.  PATIENT SURVEYS:  LEFS 17/80  COGNITION: Overall cognitive status: Within functional limits for tasks assessed     SENSATION: Light touch: Impaired   EDEMA:  Minimal swelling around R ankle   POSTURE: No Significant postural limitations  PALPATION: Slight tenderness around surgical site   LOWER EXTREMITY ROM:  Passive ROM Right eval Left eval  Hip flexion    Hip extension    Hip abduction    Hip adduction    Hip internal rotation    Hip external rotation    Knee flexion [facility]  Knee extension [facility]  Ankle dorsiflexion -15 WFL  Ankle plantarflexion [facility]  Ankle inversion    Ankle eversion     (Blank rows = not tested)  ! Indicates pain with  testing  LOWER EXTREMITY MMT: deferred d/t recent surgery  MMT Right eval Left eval  Hip flexion    Hip extension    Hip abduction    Hip adduction    Hip internal rotation    Hip external rotation    Knee flexion    Knee extension    Ankle dorsiflexion    Ankle plantarflexion    Ankle inversion    Ankle eversion     (Blank rows = not tested)  ! Indicates pain with testing  GAIT: Distance walked: 141ft Assistive device utilized: Crutches Level of assistance: Modified independence Comments: uses 3 point NWB pattern on RLE  [facility] Adult PT Treatment:                                                DATE: 02/23/2024 Therapeutic Activity: NuStep 8' for activity tolerance' STS with staggered stance, RLE dominant in Boot 2x10, lowest table height, no UE assist Seated heel raise 2x30 Seated inversion/eversion 2x15 ea. Seated DF stretch (dont pass neutral)  2x1' Boot management Took 2 wedges out, tested tolerance along the way, currently 100% WB in boot with 2 wedges left. POC discussion regarding visits remaining and potential need for an early long term HEP   [facility] Adult PT Treatment:                                                DATE: 02/18/24 Therapeutic Exercise: PF stretch with towel 60s x2 Nustep L4 8 min Manual Therapy: Subtalar mobs 5x10 all directions Therapeutic Activity: Toe taps 60s seated Heel taps 60s seated Inversion/eversion over towel 60s  [facility] Adult PT Treatment:                                                DATE: 02/15/2024  Therapeutic Exercise: Triplanar AROM of R foot Long sitting gastroc stretch with strap 2x2' Long sitting soelus stretch with strap 2x2'  Therapeutic Activity: Standing weight shifts on RLE w/CAM boot 2x10, hold 4s at 25% body weight (~40lbs) Phase II protocol review Education on anatomy, prognosis, and activity modification at home  [facility] Adult PT Treatment:                                                DATE: 02/02/2024  Self Care: Pt education, detailed below POC discussion    PATIENT EDUCATION:  Education details: Pt received education regarding HEP performance, ADL performance, functional activity tolerance, impairment education, appropriate performance of therapeutic activities.DME use, surgical protocol Person educated: Patient Education method: Explanation, Demonstration, Tactile cues, Verbal cues, and Handouts Education comprehension: verbalized understanding and returned demonstration  HOME EXERCISE PROGRAM: Access Code: [email or web address] URL: [email or web address] Date: 02/15/2024 Prepared by: Albesa Huguenin  Exercises - Supine Ankle Circles  - 1 x daily - 7 x weekly - 2 sets - 1 reps - 14m hold - Ankle Dorsiflexion with Resistance  - 1 x daily - 7 x weekly - 2-3 sets - 10-15 reps - 2s hold - Ankle Eversion with Resistance  - 1 x daily - 7 x weekly - 2-3 sets - 10-15 reps - 2s hold - Ankle Inversion with Resistance  - 1 x daily - 7 x weekly - 2-3 sets - 10-15 reps - 2s hold - Ankle and Toe Plantarflexion with Resistance  - 1 x daily - 7 x weekly - 2-3 sets - 10-15 reps - 2s hold - Long Sitting Calf Stretch with Strap  - 1 x daily - 7 x weekly - 2 sets - 1 reps - 2' hold ---------------------------------------------------------------------------------------------  ASSESSMENT:  CLINICAL IMPRESSION: Pt attended physical therapy session for continuation of treatment regarding R achilles repair. Today's treatment focused on improvement of education surrounding CAM Boot/wedge management, WB tolerance, ankle/foot strength, and DF ROM within surgical protocol. Pt showed  great tolerance to administered treatment with no adverse effects by the end of session. Skilled intervention was utilized via activity modification for pt tolerance with task completion, functional  progression/regression promoting best outcomes inline with current rehab goals, as well as moderate verbal/tactile cuing alongside no physical assistance for safe and appropriate performance of today's activities. Continue to progress within protocol as tolerated. Pt is 6 weeks post-op as of 02/24/2024.    Eval impression (02/02/2024): Pt. attended today's physical therapy session for evaluation of RLE achilles repair. Pt has complaints of 1/10 pain at rest, difficulties getting around and reduced community participation in martial arts. Pt has notable deficits with ankle ROM, strength due to immobility, and hypersensitivity surrounding surgical site. Pt would benefit from therapeutic focus on ankle A/PROM, ankle strengthening, gait and activity tolerance as allowed within surgical protocol. No protocol is currently attainable at eval, pt stated that she has a paper packet she will bring to next session with all of that information.  Treatment performed today focused on pt education detailed in obj. Pt demonstrated great understanding of education provided. required minimal verbal/tactile cues and no physical assistance for appropriate performance with today's activities. Pt requires the intervention of skilled outpatient physical therapy to address the aforementioned deficits and progress towards a functional level in line with therapeutic goals.   OBJECTIVE IMPAIRMENTS: Abnormal gait, decreased activity tolerance, decreased balance, decreased coordination, decreased mobility, difficulty walking, decreased ROM, decreased strength, hypomobility, increased edema, impaired flexibility, and pain.   ACTIVITY LIMITATIONS: carrying, standing, squatting, stairs, transfers, toileting, self feeding, locomotion level, and caring for others  PARTICIPATION LIMITATIONS: meal prep, cleaning, driving, shopping, community activity, and occupation  PERSONAL FACTORS: Behavior  pattern, Time since onset of  injury/illness/exacerbation, and Transportation are also affecting patient's functional outcome.   REHAB POTENTIAL: Good  CLINICAL DECISION MAKING: Stable/uncomplicated  EVALUATION COMPLEXITY: Low   GOALS: Goals reviewed with patient? YES  SHORT TERM GOALS: Target date: 03/15/2024  Pt will be independent with administered HEP to demonstrate the competency necessary for long term managemnet of symptoms at home.  Baseline: Goal status: INITIAL  2.  Pt will be able to WB at least 90% with CAM boot through RLE to demonstrate improved WB ability necessary for household/community ambulation Baseline:  Goal status: INITIAL   LONG TERM GOALS: Target date: 04/26/2024  Pt. Will achieve a LEFS score of 60/80 as to demonstrate improvement in self-perceived functional ability with daily activities.  Baseline: 17/80 Goal status: INITIAL  2.  Pt will improve Global ankle/foot strength to a 4+/5 to demonstrate improvement in strength for quality of motion and activity performance.  Baseline:  Goal status: INITIAL  3.   Pt will independently ambulate 106ft with LRAD, no CAM boot, no LOB, and functional ROM to allow for heel-toe gait pattern as to demonstrate improved weightbearing tolerance, BLE strength, and functional capacity for community ambulation.  Baseline: NWB in CAM boot with B crutches Goal status: INITIAL  4.  Pt will report the ability to stand for >/= 60 minutes as to demonstrate improved tolerance to standing for prolonged time and improved ability to participate in martial arts practice and take care of children.  Baseline:  Goal status: INITIAL  5.  Pt will report pain levels improving during ADLs to be less than or equal to 2/10 as to demonstrate improved tolerance with daily functional activities such as care taking and cleaning the house. Baseline:  Goal status:  INITIAL   --------------------------------------------------------------------------------------------- PLAN:  PT FREQUENCY: 1-2x/week  PT DURATION: 12 weeks  PLANNED INTERVENTIONS: 97110-Therapeutic exercises, 97530- Therapeutic activity, [id number]- Neuromuscular re-education, 97535- Self Care, [id number]- Manual therapy, [phone]- Gait training, [phone]- Electrical stimulation (manual), [id number]- Vasopneumatic device, Patient/Family education, Balance training, Stair training, Taping, Dry Needling, and Joint mobilization  PLAN FOR NEXT SESSION: Continue to progress within protocol as tolerated. Pt is 6 weeks post-op as of 02/24/2024.   Albesa Huguenin, PT, DPT 02/23/2024, 9:10 AM    For all possible CPT codes, reference the Planned Interventions line above.     Check all conditions that are expected to impact treatment: {Conditions expected to impact treatment:None of these apply   If treatment provided at initial evaluation, no treatment charged due to lack of authorization.

## 2024-02-29 ENCOUNTER — Encounter: Payer: Self-pay | Admitting: Physical Therapy

## 2024-02-29 ENCOUNTER — Ambulatory Visit: Admitting: Physical Therapy

## 2024-02-29 DIAGNOSIS — M25671 Stiffness of right ankle, not elsewhere classified: Secondary | ICD-10-CM

## 2024-02-29 DIAGNOSIS — M7099 Unspecified soft tissue disorder related to use, overuse and pressure multiple sites: Secondary | ICD-10-CM

## 2024-02-29 DIAGNOSIS — M25571 Pain in right ankle and joints of right foot: Secondary | ICD-10-CM

## 2024-02-29 NOTE — Therapy (Signed)
 OUTPATIENT PHYSICAL THERAPY TREATMENT NOTE   Patient Name: Terri Howard MRN: [id number] DOB:Mar 19, 1991, 33 y.o., female Today's Date: 02/29/2024  END OF SESSION:  PT End of Session - 02/29/24 0835     Visit Number 5    Number of Visits 25    Date for PT Re-Evaluation 04/26/24    Authorization Type [place] [employer]    Authorization Time Period Approved 6 visits 02/01/24-04/01/24    Authorization - Visit Number 5    Authorization - Number of Visits 6    PT Start Time 0835    PT Stop Time 0913    PT Time Calculation (min) 38 min    Activity Tolerance Patient tolerated treatment well    Behavior During Therapy [facility] for tasks assessed/performed                Past Medical History:  Diagnosis Date   Asthma    Chlamydia    GBS carrier 08/23/2011   Screening for HIV (human immunodeficiency virus)    Urinary tract infection, recurrent    Past Surgical History:  Procedure Laterality Date   BREAST LUMPECTOMY  2000   fibroadenma   Patient Active Problem List   Diagnosis Date Noted   SVD (spontaneous vaginal delivery) 06/30/2020   Postpartum care following vaginal delivery 9/26 06/30/2020   Bipolar 2 disorder (HCC) 01/09/2019   Suicide attempt by drug ingestion (HCC)    Depression 11/07/2014   TCA (tricyclic antidepressant) overdose of undetermined intent 11/05/2014   chronic asthma  08/23/2011    PCP: not in system  REFERRING PROVIDER: Donnamarie Gables, MD  REFERRING DIAG: S/P RIGHT ACHILLE RECONSTRUCTION SURGERY 01/13/24 ,per patient   Rationale for Evaluation and Treatment: Rehabilitation  THERAPY DIAG:  Pain in right ankle and joints of right foot  Stiffness of right ankle, not elsewhere classified  Unspecified soft tissue disorder related to use, overuse and pressure multiple sites  PERTINENT HISTORY: Asthma (uses an inhaler)  WEIGHT BEARING RESTRICTIONS: Progressive WBAT   FALLS:  Has patient fallen in last 6 months? Yes. Number of falls 2, rolled over  on a toe while using crutches  LIVING ENVIRONMENT: Lives with: lives with their family Lives in: House/apartment Stairs: no stairs Has following equipment at home: Crutches, shower chair, and has an order for a knee scooter  OCCUPATION: mental health therapist LCSW   PRECAUTIONS: None ---------------------------------------------------------------------------------------------  SUBJECTIVE:   SUBJECTIVE STATEMENT:  Pt attended today's session with reports of 0/10 pain. Pt stated that they have maintained good compliance with current HEP.  Hasn't taken any wedges out since last visit, no issues since last visit when multiple wedges were taken out.   Eval statement 02/02/2024: tore achilles on April 3rd, had an ultrasound performed at a [place] urgent care. Surgery to reconstruct on April 10th at the [facility] surgical center. They gave her a packet with protocols and rehab information in it, currently not with her, its in her work bag.  RED FLAGS: None   PLOF: Independent  PATIENT GOALS: get back to martial arts, kung fu and boxing primarily   NEXT MD VISIT: may 30th ---------------------------------------------------------------------------------------------  OBJECTIVE:  Note: Objective measures were completed at Evaluation unless otherwise noted.  PATIENT SURVEYS:  LEFS 17/80  COGNITION: Overall cognitive status: Within functional limits for tasks assessed     SENSATION: Light touch: Impaired   EDEMA:  Minimal swelling around R ankle   POSTURE: No Significant postural limitations  PALPATION: Slight tenderness around surgical site   LOWER  EXTREMITY ROM:  Passive ROM Right eval Left eval  Hip flexion    Hip extension    Hip abduction    Hip adduction    Hip internal rotation    Hip external rotation    Knee flexion [facility]  Knee extension [facility]  Ankle dorsiflexion -15 WFL  Ankle plantarflexion [facility]  Ankle inversion    Ankle eversion      (Blank rows = not tested)  ! Indicates pain with testing  LOWER EXTREMITY MMT: deferred d/t recent surgery  MMT Right eval Left eval  Hip flexion    Hip extension    Hip abduction    Hip adduction    Hip internal rotation    Hip external rotation    Knee flexion    Knee extension    Ankle dorsiflexion    Ankle plantarflexion    Ankle inversion    Ankle eversion     (Blank rows = not tested)  ! Indicates pain with testing  GAIT: Distance walked: 110ft Assistive device utilized: Crutches Level of assistance: Modified independence Comments: uses 3 point NWB pattern on RLE  [facility] Adult PT Treatment:                                                DATE: 02/29/2024  Therapeutic Exercise: Gastroc stretch with strap 3x1' Soleus stretch with strap 3x1'  Therapeutic Activity: NuStep 8' for activity tolerance Boot management Took 1 wedges out, tested tolerance along the way, currently 100% WB in boot with 1 heel lift wedge left. Seated heel raise 2x10, hold 3s Self Care: POC discussion Pt education   [facility] Adult PT Treatment:                                                DATE: 02/23/2024 Therapeutic Activity: NuStep 8' for activity tolerance' STS with staggered stance, RLE dominant in Boot 2x10, lowest table height, no UE assist Seated heel raise 2x30 Seated inversion/eversion 2x15 ea. Seated DF stretch (dont pass neutral)  2x1' Boot management Took 2 wedges out, tested tolerance along the way, currently 100% WB in boot with 2 wedges left. POC discussion regarding visits remaining and potential need for an early long term HEP   [facility] Adult PT Treatment:                                                DATE: 02/18/24 Therapeutic Exercise: PF stretch with towel 60s x2 Nustep L4 8 min Manual Therapy: Subtalar mobs 5x10 all directions Therapeutic Activity: Toe taps 60s seated Heel taps 60s seated Inversion/eversion over towel 60s  [facility] Adult PT Treatment:                                                 DATE: 02/15/2024  Therapeutic Exercise: Triplanar AROM of R foot Long sitting gastroc stretch with strap 2x2' Long sitting soelus stretch with strap 2x2'  Therapeutic Activity:  Standing weight shifts on RLE w/CAM boot 2x10, hold 4s at 25% body weight (~40lbs) Phase II protocol review Education on anatomy, prognosis, and activity modification at home                                                                                                                                [facility] Adult PT Treatment:                                                DATE: 02/02/2024  Self Care: Pt education, detailed below POC discussion    PATIENT EDUCATION:  Education details: Pt received education regarding HEP performance, ADL performance, functional activity tolerance, impairment education, appropriate performance of therapeutic activities.DME use, surgical protocol Person educated: Patient Education method: Explanation, Demonstration, Tactile cues, Verbal cues, and Handouts Education comprehension: verbalized understanding and returned demonstration  HOME EXERCISE PROGRAM: Access Code: [email or web address] URL: [email or web address] Date: 02/15/2024 Prepared by: Albesa Huguenin  Exercises - Supine Ankle Circles  - 1 x daily - 7 x weekly - 2 sets - 1 reps - 89m hold - Ankle Dorsiflexion with Resistance  - 1 x daily - 7 x weekly - 2-3 sets - 10-15 reps - 2s hold - Ankle Eversion with Resistance  - 1 x daily - 7 x weekly - 2-3 sets - 10-15 reps - 2s hold - Ankle Inversion with Resistance  - 1 x daily - 7 x weekly - 2-3 sets - 10-15 reps - 2s hold - Ankle and Toe Plantarflexion with Resistance  - 1 x daily - 7 x weekly - 2-3 sets - 10-15 reps - 2s hold - Long Sitting Calf Stretch with Strap  - 1 x daily - 7 x weekly - 2 sets - 1 reps - 2'  hold ---------------------------------------------------------------------------------------------  ASSESSMENT:  CLINICAL IMPRESSION: Pt attended physical therapy session for continuation of treatment regarding R achilles repair. Today's treatment focused on improvement of education surrounding CAM Boot/wedge management, WB tolerance, ankle/foot strength, and DF ROM within surgical protocol. Pt showed  great tolerance to administered treatment with no adverse effects by the end of session. Skilled intervention was utilized via activity modification for pt tolerance with task completion, functional progression/regression promoting best outcomes inline with current rehab goals, as well as moderate verbal/tactile cuing alongside no physical assistance for safe and appropriate performance of today's activities. Continue to progress within protocol as tolerated. Pt is 7 weeks post-op as of 02/29/2024.    Eval impression (02/02/2024): Pt. attended today's physical therapy session for evaluation of RLE achilles repair. Pt has complaints of 1/10 pain at rest, difficulties getting around and reduced community participation in martial arts. Pt has notable deficits with ankle ROM, strength due to immobility, and hypersensitivity surrounding surgical site. Pt would benefit from therapeutic focus on ankle A/PROM, ankle  strengthening, gait and activity tolerance as allowed within surgical protocol. No protocol is currently attainable at eval, pt stated that she has a paper packet she will bring to next session with all of that information.  Treatment performed today focused on pt education detailed in obj. Pt demonstrated great understanding of education provided. required minimal verbal/tactile cues and no physical assistance for appropriate performance with today's activities. Pt requires the intervention of skilled outpatient physical therapy to address the aforementioned deficits and progress towards a functional  level in line with therapeutic goals.   OBJECTIVE IMPAIRMENTS: Abnormal gait, decreased activity tolerance, decreased balance, decreased coordination, decreased mobility, difficulty walking, decreased ROM, decreased strength, hypomobility, increased edema, impaired flexibility, and pain.   ACTIVITY LIMITATIONS: carrying, standing, squatting, stairs, transfers, toileting, self feeding, locomotion level, and caring for others  PARTICIPATION LIMITATIONS: meal prep, cleaning, driving, shopping, community activity, and occupation  PERSONAL FACTORS: Behavior pattern, Time since onset of injury/illness/exacerbation, and Transportation are also affecting patient's functional outcome.   REHAB POTENTIAL: Good  CLINICAL DECISION MAKING: Stable/uncomplicated  EVALUATION COMPLEXITY: Low   GOALS: Goals reviewed with patient? YES  SHORT TERM GOALS: Target date: 03/15/2024  Pt will be independent with administered HEP to demonstrate the competency necessary for long term managemnet of symptoms at home.  Baseline: Goal status: INITIAL  2.  Pt will be able to WB at least 90% with CAM boot through RLE to demonstrate improved WB ability necessary for household/community ambulation Baseline:  Goal status: INITIAL   LONG TERM GOALS: Target date: 04/26/2024  Pt. Will achieve a LEFS score of 60/80 as to demonstrate improvement in self-perceived functional ability with daily activities.  Baseline: 17/80 Goal status: INITIAL  2.  Pt will improve Global ankle/foot strength to a 4+/5 to demonstrate improvement in strength for quality of motion and activity performance.  Baseline:  Goal status: INITIAL  3.   Pt will independently ambulate 1052ft with LRAD, no CAM boot, no LOB, and functional ROM to allow for heel-toe gait pattern as to demonstrate improved weightbearing tolerance, BLE strength, and functional capacity for community ambulation.  Baseline: NWB in CAM boot with B crutches Goal status:  INITIAL  4.  Pt will report the ability to stand for >/= 60 minutes as to demonstrate improved tolerance to standing for prolonged time and improved ability to participate in martial arts practice and take care of children.  Baseline:  Goal status: INITIAL  5.  Pt will report pain levels improving during ADLs to be less than or equal to 2/10 as to demonstrate improved tolerance with daily functional activities such as care taking and cleaning the house. Baseline:  Goal status: INITIAL   --------------------------------------------------------------------------------------------- PLAN:  PT FREQUENCY: 1-2x/week  PT DURATION: 12 weeks  PLANNED INTERVENTIONS: 97110-Therapeutic exercises, 97530- Therapeutic activity, [id number]- Neuromuscular re-education, 97535- Self Care, [id number]- Manual therapy, [phone]- Gait training, [phone]- Electrical stimulation (manual), [id number]- Vasopneumatic device, Patient/Family education, Balance training, Stair training, Taping, Dry Needling, and Joint mobilization  PLAN FOR NEXT SESSION: Continue to progress within protocol as tolerated. Pt is 6 weeks post-op as of 02/24/2024.   Albesa Huguenin, PT, DPT 02/29/2024, 9:16 AM    For all possible CPT codes, reference the Planned Interventions line above.     Check all conditions that are expected to impact treatment: {Conditions expected to impact treatment:None of these apply   If treatment provided at initial evaluation, no treatment charged due to lack of authorization.

## 2024-03-02 ENCOUNTER — Encounter: Payer: Self-pay | Admitting: Physical Therapy

## 2024-03-02 ENCOUNTER — Ambulatory Visit: Admitting: Physical Therapy

## 2024-03-02 DIAGNOSIS — M25671 Stiffness of right ankle, not elsewhere classified: Secondary | ICD-10-CM

## 2024-03-02 DIAGNOSIS — M25571 Pain in right ankle and joints of right foot: Secondary | ICD-10-CM

## 2024-03-02 DIAGNOSIS — M7099 Unspecified soft tissue disorder related to use, overuse and pressure multiple sites: Secondary | ICD-10-CM

## 2024-03-08 ENCOUNTER — Ambulatory Visit: Admitting: Physical Therapy

## 2024-03-13 ENCOUNTER — Encounter: Payer: Self-pay | Admitting: Physical Therapy

## 2024-03-13 ENCOUNTER — Ambulatory Visit: Attending: Orthopaedic Surgery | Admitting: Physical Therapy

## 2024-03-13 DIAGNOSIS — M25571 Pain in right ankle and joints of right foot: Secondary | ICD-10-CM | POA: Insufficient documentation

## 2024-03-13 DIAGNOSIS — M25671 Stiffness of right ankle, not elsewhere classified: Secondary | ICD-10-CM | POA: Diagnosis present

## 2024-03-13 DIAGNOSIS — M7099 Unspecified soft tissue disorder related to use, overuse and pressure multiple sites: Secondary | ICD-10-CM | POA: Diagnosis present

## 2024-03-13 NOTE — Therapy (Signed)
 OUTPATIENT PHYSICAL THERAPY TREATMENT & PROGRESS NOTE   Patient Name: Terri Howard MRN: [id number] DOB:[birth year], 33 y.o., female Today's Date: 03/13/2024  END OF SESSION:  PT End of Session - 03/13/24 0836     Visit Number 7    Number of Visits 25    Date for PT Re-Evaluation 04/26/24    Authorization Type [place] [place] [facility]    Authorization Time Period Approved 6 visits 02/01/24-04/01/24    Authorization - Visit Number 6    Authorization - Number of Visits 6    PT Start Time 0835    PT Stop Time 0913    PT Time Calculation (min) 38 min    Activity Tolerance Patient tolerated treatment well    Behavior During Therapy [facility] for tasks assessed/performed                  Past Medical History:  Diagnosis Date   Asthma    Chlamydia    GBS carrier 08/23/2011   Screening for HIV (human immunodeficiency virus)    Urinary tract infection, recurrent    Past Surgical History:  Procedure Laterality Date   BREAST LUMPECTOMY  2000   fibroadenma   Patient Active Problem List   Diagnosis Date Noted   SVD (spontaneous vaginal delivery) 06/30/2020   Postpartum care following vaginal delivery 9/26 06/30/2020   Bipolar 2 disorder (HCC) 01/09/2019   Suicide attempt by drug ingestion (HCC)    Depression 11/07/2014   TCA (tricyclic antidepressant) overdose of undetermined intent 11/05/2014   chronic asthma  08/23/2011    PCP: not in system  REFERRING PROVIDER: Donnamarie Gables, MD  REFERRING DIAG: S/P RIGHT ACHILLE RECONSTRUCTION SURGERY 01/13/24 ,per patient   Rationale for Evaluation and Treatment: Rehabilitation  THERAPY DIAG:  Pain in right ankle and joints of right foot  Stiffness of right ankle, not elsewhere classified  Unspecified soft tissue disorder related to use, overuse and pressure multiple sites  PERTINENT HISTORY: Asthma (uses an inhaler)  WEIGHT BEARING RESTRICTIONS: Progressive WBAT   FALLS:  Has patient fallen in last 6 months? Yes. Number of falls  2, rolled over on a toe while using crutches  LIVING ENVIRONMENT: Lives with: lives with their family Lives in: House/apartment Stairs: no stairs Has following equipment at home: Crutches, shower chair, and has an order for a knee scooter  OCCUPATION: mental health therapist LCSW   PRECAUTIONS: None ---------------------------------------------------------------------------------------------  SUBJECTIVE:   SUBJECTIVE STATEMENT:  Pt attended today's session with reports of 0/10 pain. Pt stated that they have maintained good compliance with current HEP.  Has been driving/walking a little bit without the boot, has a f/u with surgeon on June 11th    Eval statement 02/02/2024: tore achilles on April 3rd, had an ultrasound performed at a [place] urgent care. Surgery to reconstruct on April 10th at the [facility] surgical center. They gave her a packet with protocols and rehab information in it, currently not with her, its in her work bag.  RED FLAGS: None   PLOF: Independent  PATIENT GOALS: get back to martial arts, kung fu and boxing primarily   NEXT MD VISIT: June 11th ---------------------------------------------------------------------------------------------  OBJECTIVE:  Note: Objective measures were completed at Evaluation unless otherwise noted.  PATIENT SURVEYS:  LEFS 17/80  COGNITION: Overall cognitive status: Within functional limits for tasks assessed     SENSATION: Light touch: Impaired   EDEMA:  Minimal swelling around R ankle   POSTURE: No Significant postural limitations  PALPATION: Slight tenderness around surgical site  LOWER EXTREMITY ROM:  Passive ROM Right eval Left eval  Hip flexion    Hip extension    Hip abduction    Hip adduction    Hip internal rotation    Hip external rotation    Knee flexion [facility]  Knee extension [facility]  Ankle dorsiflexion -15 WFL  Ankle plantarflexion [facility]  Ankle inversion    Ankle eversion      (Blank rows = not tested)  ! Indicates pain with testing  LOWER EXTREMITY MMT: deferred d/t recent surgery  MMT Right eval Left eval  Hip flexion    Hip extension    Hip abduction    Hip adduction    Hip internal rotation    Hip external rotation    Knee flexion    Knee extension    Ankle dorsiflexion    Ankle plantarflexion    Ankle inversion    Ankle eversion     (Blank rows = not tested)  ! Indicates pain with testing  GAIT: Distance walked: 161ft Assistive device utilized: Crutches Level of assistance: Modified independence Comments: uses 3 point NWB pattern on RLE   [facility] Adult PT Treatment:                                                DATE: 03/13/2024 Therapeutic Activity: NuStep  8' for activity tolerance Standing gastroc stretch 2x1' for ROM and WB tolerance Standing lateral weight shifts 2x10, hold 5s in SLS on RLE Standing Anterior/Posterior weight shifts 2x10, hold 5s in NBOS BLE stance POC discussion regarding current protocol, and POC focus shift once out of boot   [facility] Adult PT Treatment:                                                DATE: 03/02/2024 Therapeutic Activity: Re-evaluative measures Self Care: POC discussion Pt education regarding prognosis, HEP, home management, current surgical protocol, and rehab timeline    PATIENT EDUCATION:  Education details: Pt received education regarding HEP performance, ADL performance, functional activity tolerance, impairment education, appropriate performance of therapeutic activities.DME use, surgical protocol Person educated: Patient Education method: Explanation, Demonstration, Tactile cues, Verbal cues, and Handouts Education comprehension: verbalized understanding and returned demonstration  HOME EXERCISE PROGRAM: Access Code: [email or web address] URL: [email or web address] Date: 02/15/2024 Prepared by: Albesa Huguenin  Exercises - Supine Ankle Circles  - 1 x daily - 7 x weekly - 2 sets - 1  reps - 60m hold - Ankle Dorsiflexion with Resistance  - 1 x daily - 7 x weekly - 2-3 sets - 10-15 reps - 2s hold - Ankle Eversion with Resistance  - 1 x daily - 7 x weekly - 2-3 sets - 10-15 reps - 2s hold - Ankle Inversion with Resistance  - 1 x daily - 7 x weekly - 2-3 sets - 10-15 reps - 2s hold - Ankle and Toe Plantarflexion with Resistance  - 1 x daily - 7 x weekly - 2-3 sets - 10-15 reps - 2s hold - Long Sitting Calf Stretch with Strap  - 1 x daily - 7 x weekly - 2 sets - 1 reps - 2' hold ---------------------------------------------------------------------------------------------  ASSESSMENT:  CLINICAL IMPRESSION: Pt attended physical therapy session for  continuation of treatment regarding R achilles repair. Today's treatment focused on improvement of  WB tolerance, preparation for progression outside of CAM boot, and general ankle strengthening/mobility work. Pt showed  great tolerance to administered treatment with no adverse effects by the end of session. Skilled intervention was utilized via activity modification for pt tolerance with task completion, functional progression/regression promoting best outcomes inline with current rehab goals, as well as minimal verbal/tactile cuing alongside no physical assistance for safe and appropriate performance of today's activities. Continue to progress as tolerated outside of the boot once cleared by surgeon.  03/02/2024 Re-Eval: Pt attended physical therapy session for re-evaluation of RLE achilles. Pt has met  2 goals and continues to work towards 5 others. Pt continues to progress iwthin surgical protocol and is currently on track, next steps will involve improving WB tolerance and gait quality without the boot once cleared by the surgeon and updating HEP to begin transition to higher level activity . Pt required moderate verbal cuing as well as no physical assistance for safe and appropriate performance of today's activities.  Education was given to  continue applying ADL education from previous sessions as well as performing HEP as prescribed with freedom to progress as tolerated using previous education on modification and exercise dosage. Pt has displayed and verbalized competence regarding this education. Pt is 7 weeks post-op as of 03/02/2024.   Pt continues to require the intervention of skilled outpatient physical therapy to address the aforementioned deficits and progress towards a functional level in line with therapeutic goals.   OBJECTIVE IMPAIRMENTS: Abnormal gait, decreased activity tolerance, decreased balance, decreased coordination, decreased mobility, difficulty walking, decreased ROM, decreased strength, hypomobility, increased edema, impaired flexibility, and pain.   ACTIVITY LIMITATIONS: carrying, standing, squatting, stairs, transfers, toileting, self feeding, locomotion level, and caring for others  PARTICIPATION LIMITATIONS: meal prep, cleaning, driving, shopping, community activity, and occupation  PERSONAL FACTORS: Behavior pattern, Time since onset of injury/illness/exacerbation, and Transportation are also affecting patient's functional outcome.   REHAB POTENTIAL: Good  CLINICAL DECISION MAKING: Stable/uncomplicated  EVALUATION COMPLEXITY: Low   GOALS: Goals reviewed with patient? YES  SHORT TERM GOALS: Target date: 03/15/2024  Pt will be independent with administered HEP to demonstrate the competency necessary for long term managemnet of symptoms at home.  Baseline: Goal status: MET 03/02/2024   2.  Pt will be able to WB at least 90% with CAM boot through RLE to demonstrate improved WB ability necessary for household/community ambulation Baseline:  Goal status: MET 03/02/2024    LONG TERM GOALS: Target date: 04/26/2024  Pt. Will achieve a LEFS score of 60/80 as to demonstrate improvement in self-perceived functional ability with daily activities.  Baseline: 17/80 Goal status: PROGRESSING  03/02/2024   2.  Pt will improve Global ankle/foot strength to a 4+/5 to demonstrate improvement in strength for quality of motion and activity performance.  Baseline:  Goal status: PROGRESSING 03/02/2024  3.   Pt will independently ambulate 1074ft with LRAD, no CAM boot, no LOB, and functional ROM to allow for heel-toe gait pattern as to demonstrate improved weightbearing tolerance, BLE strength, and functional capacity for community ambulation.  Baseline: NWB in CAM boot with B crutches Goal status: PROGRESSING 03/02/2024  4.  Pt will report the ability to stand for >/= 60 minutes as to demonstrate improved tolerance to standing for prolonged time and improved ability to participate in martial arts practice and take care of children.  Baseline:  Goal status: PROGRESSING 03/02/2024  5.  Pt  will report pain levels improving during ADLs to be less than or equal to 2/10 as to demonstrate improved tolerance with daily functional activities such as care taking and cleaning the house. Baseline:  Goal status: PROGRESSING 03/02/2024   --------------------------------------------------------------------------------------------- PLAN:  PT FREQUENCY: 1-2x/week  PT DURATION: 12 weeks  PLANNED INTERVENTIONS: 97110-Therapeutic exercises, 97530- Therapeutic activity, 97112- Neuromuscular re-education, 97535- Self Care, 95284- Manual therapy, [phone]- Gait training, [phone]- Electrical stimulation (manual), 97016- Vasopneumatic device, Patient/Family education, Balance training, Stair training, Taping, Dry Needling, and Joint mobilization  PLAN FOR NEXT SESSION: Continue to progress within protocol as tolerated.Pt will be 9 weeks post-op as of 6/12/ 2025.    Albesa Huguenin, PT, DPT 03/13/2024, 9:08 AM    For all possible CPT codes, reference the Planned Interventions line above.     Check all conditions that are expected to impact treatment: {Conditions expected to impact treatment:None of these  apply   If treatment provided at initial evaluation, no treatment charged due to lack of authorization.

## 2024-03-20 ENCOUNTER — Encounter: Payer: Self-pay | Admitting: Physical Therapy

## 2024-03-20 ENCOUNTER — Ambulatory Visit: Admitting: Physical Therapy

## 2024-03-20 DIAGNOSIS — M25671 Stiffness of right ankle, not elsewhere classified: Secondary | ICD-10-CM

## 2024-03-20 DIAGNOSIS — M7099 Unspecified soft tissue disorder related to use, overuse and pressure multiple sites: Secondary | ICD-10-CM

## 2024-03-20 DIAGNOSIS — M25571 Pain in right ankle and joints of right foot: Secondary | ICD-10-CM | POA: Diagnosis not present

## 2024-03-20 NOTE — Therapy (Signed)
 OUTPATIENT PHYSICAL THERAPY TREATMENT   Patient Name: Terri Howard MRN: [id number] DOB:28-Feb-1991, 33 y.o., female Today's Date: 03/20/2024  END OF SESSION:  PT End of Session - 03/20/24 0919     Visit Number 8    Number of Visits 25    Date for PT Re-Evaluation 04/26/24    Authorization Time Period Approved 5 visits 03/13/24-05/11/24    Authorization - Visit Number 1    Authorization - Number of Visits 5    PT Start Time 0919    PT Stop Time 0957    PT Time Calculation (min) 38 min    Activity Tolerance Patient tolerated treatment well    Behavior During Therapy [facility] for tasks assessed/performed                Past Medical History:  Diagnosis Date   Asthma    Chlamydia    GBS carrier 08/23/2011   Screening for HIV (human immunodeficiency virus)    Urinary tract infection, recurrent    Past Surgical History:  Procedure Laterality Date   BREAST LUMPECTOMY  2000   fibroadenma   Patient Active Problem List   Diagnosis Date Noted   SVD (spontaneous vaginal delivery) 06/30/2020   Postpartum care following vaginal delivery 9/26 06/30/2020   Bipolar 2 disorder (HCC) 01/09/2019   Suicide attempt by drug ingestion (HCC)    Depression 11/07/2014   TCA (tricyclic antidepressant) overdose of undetermined intent 11/05/2014   chronic asthma  08/23/2011    PCP: not in system  REFERRING PROVIDER: Donnamarie Gables, MD  REFERRING DIAG: S/P RIGHT ACHILLE RECONSTRUCTION SURGERY 01/13/24 ,per patient   Rationale for Evaluation and Treatment: Rehabilitation  THERAPY DIAG:  Pain in right ankle and joints of right foot  Stiffness of right ankle, not elsewhere classified  Unspecified soft tissue disorder related to use, overuse and pressure multiple sites  PERTINENT HISTORY: Asthma (uses an inhaler)  WEIGHT BEARING RESTRICTIONS: Progressive WBAT   FALLS:  Has patient fallen in last 6 months? Yes. Number of falls 2, rolled over on a toe while using  crutches  LIVING ENVIRONMENT: Lives with: lives with their family Lives in: House/apartment Stairs: no stairs Has following equipment at home: Crutches, shower chair, and has an order for a knee scooter  OCCUPATION: mental health therapist LCSW   PRECAUTIONS: None ---------------------------------------------------------------------------------------------  SUBJECTIVE:   SUBJECTIVE STATEMENT:  Pt attended today's session with reports of 0/10 pain. Has stopped wearing the boot for the most part, just slight discomfort and lack of trust in the foot, has a follow up appt w/ surgeon today.     Eval statement 02/02/2024: tore achilles on April 3rd, had an ultrasound performed at a [place] urgent care. Surgery to reconstruct on April 10th at the [facility] surgical center. They gave her a packet with protocols and rehab information in it, currently not with her, its in her work bag.  RED FLAGS: None   PLOF: Independent  PATIENT GOALS: get back to martial arts, kung fu and boxing primarily   NEXT MD VISIT: June 11th ---------------------------------------------------------------------------------------------  OBJECTIVE:  Note: Objective measures were completed at Evaluation unless otherwise noted.  PATIENT SURVEYS:  LEFS 17/80  COGNITION: Overall cognitive status: Within functional limits for tasks assessed     SENSATION: Light touch: Impaired   EDEMA:  Minimal swelling around R ankle   POSTURE: No Significant postural limitations  PALPATION: Slight tenderness around surgical site   LOWER EXTREMITY ROM:  Passive ROM Right eval Left eval  Hip flexion    Hip extension    Hip abduction    Hip adduction    Hip internal rotation    Hip external rotation    Knee flexion [facility]  Knee extension [facility]  Ankle dorsiflexion -15 WFL  Ankle plantarflexion [facility]  Ankle inversion    Ankle eversion     (Blank rows = not tested)  ! Indicates pain with  testing  LOWER EXTREMITY MMT: deferred d/t recent surgery  MMT Right eval Left eval  Hip flexion    Hip extension    Hip abduction    Hip adduction    Hip internal rotation    Hip external rotation    Knee flexion    Knee extension    Ankle dorsiflexion    Ankle plantarflexion    Ankle inversion    Ankle eversion     (Blank rows = not tested)  ! Indicates pain with testing  GAIT: Distance walked: 141ft Assistive device utilized: Crutches Level of assistance: Modified independence Comments: uses 3 point NWB pattern on RLE  [facility] Adult PT Treatment:                                                DATE: 03/20/2024 Therapeutic Activity: NuStep  8' for activity tolerance Standing gastroc stretch 2x1' for ROM and WB tolerance Standing soleus stretch using 6 box 2x1' for ROM and WB tolerance Standing lateral weight shifts 2x12, hold 5s in SLS  Gait training  Practice with heel > toe transition,  [facility] Adult PT Treatment:                                                DATE: 03/13/2024 Therapeutic Activity: NuStep  8' for activity tolerance Standing gastroc stretch 2x1' for ROM and WB tolerance Standing lateral weight shifts 2x10, hold 5s in SLS on RLE Standing Anterior/Posterior weight shifts 2x10, hold 5s in NBOS BLE stance POC discussion regarding current protocol, and POC focus shift once out of boot  PATIENT EDUCATION:  Education details: Pt received education regarding HEP performance, ADL performance, functional activity tolerance, impairment education, appropriate performance of therapeutic activities.DME use, surgical protocol Person educated: Patient Education method: Explanation, Demonstration, Tactile cues, Verbal cues, and Handouts Education comprehension: verbalized understanding and returned demonstration  HOME EXERCISE PROGRAM: Access Code: [email or web address] URL: [email or web address] Date: 02/15/2024 Prepared by: Albesa Huguenin  Exercises -  Supine Ankle Circles  - 1 x daily - 7 x weekly - 2 sets - 1 reps - 26m hold - Ankle Dorsiflexion with Resistance  - 1 x daily - 7 x weekly - 2-3 sets - 10-15 reps - 2s hold - Ankle Eversion with Resistance  - 1 x daily - 7 x weekly - 2-3 sets - 10-15 reps - 2s hold - Ankle Inversion with Resistance  - 1 x daily - 7 x weekly - 2-3 sets - 10-15 reps - 2s hold - Ankle and Toe Plantarflexion with Resistance  - 1 x daily - 7 x weekly - 2-3 sets - 10-15 reps - 2s hold - Long Sitting Calf Stretch with Strap  - 1 x daily - 7 x weekly - 2 sets - 1 reps - 2' hold ---------------------------------------------------------------------------------------------  ASSESSMENT:  CLINICAL IMPRESSION: Pt attended physical therapy session for continuation of treatment regarding R achilles repair. Today's treatment focused on improvement of  WB tolerance on RLE in/out of the CAM boot as well as gait quality from 1s-3rd rocker. Pt showed great tolerance to administered treatment with no adverse effects by the end of session. Skilled intervention was utilized via activity modification for pt tolerance with task completion, functional progression/regression promoting best outcomes inline with current rehab goals, as well as minimal verbal/tactile cuing alongside no physical assistance for safe and appropriate performance of today's activities. F/u on surgeons visit next session.   03/02/2024 Re-Eval: Pt attended physical therapy session for re-evaluation of RLE achilles. Pt has met  2 goals and continues to work towards 5 others. Pt continues to progress iwthin surgical protocol and is currently on track, next steps will involve improving WB tolerance and gait quality without the boot once cleared by the surgeon and updating HEP to begin transition to higher level activity . Pt required moderate verbal cuing as well as no physical assistance for safe and appropriate performance of today's activities.  Education was given to  continue applying ADL education from previous sessions as well as performing HEP as prescribed with freedom to progress as tolerated using previous education on modification and exercise dosage. Pt has displayed and verbalized competence regarding this education. Pt is 7 weeks post-op as of 03/02/2024.   Pt continues to require the intervention of skilled outpatient physical therapy to address the aforementioned deficits and progress towards a functional level in line with therapeutic goals.   OBJECTIVE IMPAIRMENTS: Abnormal gait, decreased activity tolerance, decreased balance, decreased coordination, decreased mobility, difficulty walking, decreased ROM, decreased strength, hypomobility, increased edema, impaired flexibility, and pain.   ACTIVITY LIMITATIONS: carrying, standing, squatting, stairs, transfers, toileting, self feeding, locomotion level, and caring for others  PARTICIPATION LIMITATIONS: meal prep, cleaning, driving, shopping, community activity, and occupation  PERSONAL FACTORS: Behavior pattern, Time since onset of injury/illness/exacerbation, and Transportation are also affecting patient's functional outcome.   REHAB POTENTIAL: Good  CLINICAL DECISION MAKING: Stable/uncomplicated  EVALUATION COMPLEXITY: Low   GOALS: Goals reviewed with patient? YES  SHORT TERM GOALS: Target date: 03/15/2024  Pt will be independent with administered HEP to demonstrate the competency necessary for long term managemnet of symptoms at home.  Baseline: Goal status: MET 03/02/2024   2.  Pt will be able to WB at least 90% with CAM boot through RLE to demonstrate improved WB ability necessary for household/community ambulation Baseline:  Goal status: MET 03/02/2024    LONG TERM GOALS: Target date: 04/26/2024  Pt. Will achieve a LEFS score of 60/80 as to demonstrate improvement in self-perceived functional ability with daily activities.  Baseline: 17/80 Goal status: PROGRESSING  03/02/2024   2.  Pt will improve Global ankle/foot strength to a 4+/5 to demonstrate improvement in strength for quality of motion and activity performance.  Baseline:  Goal status: PROGRESSING 03/02/2024  3.   Pt will independently ambulate 1023ft with LRAD, no CAM boot, no LOB, and functional ROM to allow for heel-toe gait pattern as to demonstrate improved weightbearing tolerance, BLE strength, and functional capacity for community ambulation.  Baseline: NWB in CAM boot with B crutches Goal status: PROGRESSING 03/02/2024  4.  Pt will report the ability to stand for >/= 60 minutes as to demonstrate improved tolerance to standing for prolonged time and improved ability to participate in martial arts practice and take care of children.  Baseline:  Goal status:  PROGRESSING 03/02/2024  5.  Pt will report pain levels improving during ADLs to be less than or equal to 2/10 as to demonstrate improved tolerance with daily functional activities such as care taking and cleaning the house. Baseline:  Goal status: PROGRESSING 03/02/2024   --------------------------------------------------------------------------------------------- PLAN:  PT FREQUENCY: 1-2x/week  PT DURATION: 12 weeks  PLANNED INTERVENTIONS: 97110-Therapeutic exercises, 97530- Therapeutic activity, 97112- Neuromuscular re-education, 97535- Self Care, 95621- Manual therapy, [phone]- Gait training, [phone]- Electrical stimulation (manual), 97016- Vasopneumatic device, Patient/Family education, Balance training, Stair training, Taping, Dry Needling, and Joint mobilization  PLAN FOR NEXT SESSION: Continue to progress within protocol as tolerated.Pt will be 10 weeks post-op as of 6/16/ 2025.    Albesa Huguenin, PT, DPT 03/20/2024, 9:57 AM    For all possible CPT codes, reference the Planned Interventions line above.     Check all conditions that are expected to impact treatment: {Conditions expected to impact treatment:None of these  apply   If treatment provided at initial evaluation, no treatment charged due to lack of authorization.

## 2024-03-21 NOTE — Therapy (Deleted)
 OUTPATIENT PHYSICAL THERAPY TREATMENT   Patient Name: Terri Howard MRN: [id number] DOB:October 23, 1990, 33 y.o., female Today's Date: 03/21/2024  END OF SESSION:          Past Medical History:  Diagnosis Date   Asthma    Chlamydia    GBS carrier 08/23/2011   Screening for HIV (human immunodeficiency virus)    Urinary tract infection, recurrent    Past Surgical History:  Procedure Laterality Date   BREAST LUMPECTOMY  2000   fibroadenma   Patient Active Problem List   Diagnosis Date Noted   SVD (spontaneous vaginal delivery) 06/30/2020   Postpartum care following vaginal delivery 9/26 06/30/2020   Bipolar 2 disorder (HCC) 01/09/2019   Suicide attempt by drug ingestion (HCC)    Depression 11/07/2014   TCA (tricyclic antidepressant) overdose of undetermined intent 11/05/2014   chronic asthma  08/23/2011    PCP: not in system  REFERRING PROVIDER: Donnamarie Gables, MD  REFERRING DIAG: S/P RIGHT ACHILLE RECONSTRUCTION SURGERY 01/13/24 ,per patient   Rationale for Evaluation and Treatment: Rehabilitation  THERAPY DIAG:  No diagnosis found.  PERTINENT HISTORY: Asthma (uses an inhaler)  WEIGHT BEARING RESTRICTIONS: Progressive WBAT   FALLS:  Has patient fallen in last 6 months? Yes. Number of falls 2, rolled over on a toe while using crutches  LIVING ENVIRONMENT: Lives with: lives with their family Lives in: House/apartment Stairs: no stairs Has following equipment at home: Crutches, shower chair, and has an order for a knee scooter  OCCUPATION: mental health therapist LCSW   PRECAUTIONS: None ---------------------------------------------------------------------------------------------  SUBJECTIVE:   SUBJECTIVE STATEMENT:  Pt attended today's session with reports of 0/10 pain. Has stopped wearing the boot for the most part, just slight discomfort and lack of trust in the foot, has a follow up appt w/ surgeon today.     Eval statement 02/02/2024:  tore achilles on April 3rd, had an ultrasound performed at a [place] urgent care. Surgery to reconstruct on April 10th at the [facility] surgical center. They gave her a packet with protocols and rehab information in it, currently not with her, its in her work bag.  RED FLAGS: None   PLOF: Independent  PATIENT GOALS: get back to martial arts, kung fu and boxing primarily   NEXT MD VISIT: June 11th ---------------------------------------------------------------------------------------------  OBJECTIVE:  Note: Objective measures were completed at Evaluation unless otherwise noted.  PATIENT SURVEYS:  LEFS 17/80  COGNITION: Overall cognitive status: Within functional limits for tasks assessed     SENSATION: Light touch: Impaired   EDEMA:  Minimal swelling around R ankle   POSTURE: No Significant postural limitations  PALPATION: Slight tenderness around surgical site   LOWER EXTREMITY ROM:  Passive ROM Right eval Left eval  Hip flexion    Hip extension    Hip abduction    Hip adduction    Hip internal rotation    Hip external rotation    Knee flexion [facility]  Knee extension [facility]  Ankle dorsiflexion -15 WFL  Ankle plantarflexion [facility] WFL  Ankle inversion    Ankle eversion     (Blank rows = not tested)  ! Indicates pain with testing  LOWER EXTREMITY MMT: deferred d/t recent surgery  MMT Right eval Left eval  Hip flexion    Hip extension    Hip abduction    Hip adduction    Hip internal rotation    Hip external rotation    Knee flexion    Knee extension    Ankle  dorsiflexion    Ankle plantarflexion    Ankle inversion    Ankle eversion     (Blank rows = not tested)  ! Indicates pain with testing  GAIT: Distance walked: 118ft Assistive device utilized: Crutches Level of assistance: Modified independence Comments: uses 3 point NWB pattern on RLE  [facility] Adult PT Treatment:                                                DATE:  03/20/2024 Therapeutic Activity: NuStep  8' for activity tolerance Standing gastroc stretch 2x1' for ROM and WB tolerance Standing soleus stretch using 6 box 2x1' for ROM and WB tolerance Standing lateral weight shifts 2x12, hold 5s in SLS  Gait training  Practice with heel > toe transition,  [facility] Adult PT Treatment:                                                DATE: 03/13/2024 Therapeutic Activity: NuStep  8' for activity tolerance Standing gastroc stretch 2x1' for ROM and WB tolerance Standing lateral weight shifts 2x10, hold 5s in SLS on RLE Standing Anterior/Posterior weight shifts 2x10, hold 5s in NBOS BLE stance POC discussion regarding current protocol, and POC focus shift once out of boot  PATIENT EDUCATION:  Education details: Pt received education regarding HEP performance, ADL performance, functional activity tolerance, impairment education, appropriate performance of therapeutic activities.DME use, surgical protocol Person educated: Patient Education method: Explanation, Demonstration, Tactile cues, Verbal cues, and Handouts Education comprehension: verbalized understanding and returned demonstration  HOME EXERCISE PROGRAM: Access Code: [email or web address] URL: [email or web address] Date: 02/15/2024 Prepared by: Albesa Huguenin  Exercises - Supine Ankle Circles  - 1 x daily - 7 x weekly - 2 sets - 1 reps - 72m hold - Ankle Dorsiflexion with Resistance  - 1 x daily - 7 x weekly - 2-3 sets - 10-15 reps - 2s hold - Ankle Eversion with Resistance  - 1 x daily - 7 x weekly - 2-3 sets - 10-15 reps - 2s hold - Ankle Inversion with Resistance  - 1 x daily - 7 x weekly - 2-3 sets - 10-15 reps - 2s hold - Ankle and Toe Plantarflexion with Resistance  - 1 x daily - 7 x weekly - 2-3 sets - 10-15 reps - 2s hold - Long Sitting Calf Stretch with Strap  - 1 x daily - 7 x weekly - 2 sets - 1 reps - 2'  hold ---------------------------------------------------------------------------------------------  ASSESSMENT:  CLINICAL IMPRESSION: Pt attended physical therapy session for continuation of treatment regarding R achilles repair. Today's treatment focused on improvement of  WB tolerance on RLE in/out of the CAM boot as well as gait quality from 1s-3rd rocker. Pt showed great tolerance to administered treatment with no adverse effects by the end of session. Skilled intervention was utilized via activity modification for pt tolerance with task completion, functional progression/regression promoting best outcomes inline with current rehab goals, as well as minimal verbal/tactile cuing alongside no physical assistance for safe and appropriate performance of today's activities. F/u on surgeons visit next session.   03/02/2024 Re-Eval: Pt attended physical therapy session for re-evaluation of RLE achilles. Pt has met  2 goals and continues to work towards 5  others. Pt continues to progress iwthin surgical protocol and is currently on track, next steps will involve improving WB tolerance and gait quality without the boot once cleared by the surgeon and updating HEP to begin transition to higher level activity . Pt required moderate verbal cuing as well as no physical assistance for safe and appropriate performance of today's activities.  Education was given to continue applying ADL education from previous sessions as well as performing HEP as prescribed with freedom to progress as tolerated using previous education on modification and exercise dosage. Pt has displayed and verbalized competence regarding this education. Pt is 7 weeks post-op as of 03/02/2024.   Pt continues to require the intervention of skilled outpatient physical therapy to address the aforementioned deficits and progress towards a functional level in line with therapeutic goals.   OBJECTIVE IMPAIRMENTS: Abnormal gait, decreased activity  tolerance, decreased balance, decreased coordination, decreased mobility, difficulty walking, decreased ROM, decreased strength, hypomobility, increased edema, impaired flexibility, and pain.   ACTIVITY LIMITATIONS: carrying, standing, squatting, stairs, transfers, toileting, self feeding, locomotion level, and caring for others  PARTICIPATION LIMITATIONS: meal prep, cleaning, driving, shopping, community activity, and occupation  PERSONAL FACTORS: Behavior pattern, Time since onset of injury/illness/exacerbation, and Transportation are also affecting patient's functional outcome.   REHAB POTENTIAL: Good  CLINICAL DECISION MAKING: Stable/uncomplicated  EVALUATION COMPLEXITY: Low   GOALS: Goals reviewed with patient? YES  SHORT TERM GOALS: Target date: 03/15/2024  Pt will be independent with administered HEP to demonstrate the competency necessary for long term managemnet of symptoms at home.  Baseline: Goal status: MET 03/02/2024   2.  Pt will be able to WB at least 90% with CAM boot through RLE to demonstrate improved WB ability necessary for household/community ambulation Baseline:  Goal status: MET 03/02/2024    LONG TERM GOALS: Target date: 04/26/2024  Pt. Will achieve a LEFS score of 60/80 as to demonstrate improvement in self-perceived functional ability with daily activities.  Baseline: 17/80 Goal status: PROGRESSING 03/02/2024   2.  Pt will improve Global ankle/foot strength to a 4+/5 to demonstrate improvement in strength for quality of motion and activity performance.  Baseline:  Goal status: PROGRESSING 03/02/2024  3.   Pt will independently ambulate 108ft with LRAD, no CAM boot, no LOB, and functional ROM to allow for heel-toe gait pattern as to demonstrate improved weightbearing tolerance, BLE strength, and functional capacity for community ambulation.  Baseline: NWB in CAM boot with B crutches Goal status: PROGRESSING 03/02/2024  4.  Pt will report the ability to  stand for >/= 60 minutes as to demonstrate improved tolerance to standing for prolonged time and improved ability to participate in martial arts practice and take care of children.  Baseline:  Goal status: PROGRESSING 03/02/2024  5.  Pt will report pain levels improving during ADLs to be less than or equal to 2/10 as to demonstrate improved tolerance with daily functional activities such as care taking and cleaning the house. Baseline:  Goal status: PROGRESSING 03/02/2024   --------------------------------------------------------------------------------------------- PLAN:  PT FREQUENCY: 1-2x/week  PT DURATION: 12 weeks  PLANNED INTERVENTIONS: 97110-Therapeutic exercises, 97530- Therapeutic activity, 97112- Neuromuscular re-education, 97535- Self Care, 16109- Manual therapy, [phone]- Gait training, [phone]- Electrical stimulation (manual), 97016- Vasopneumatic device, Patient/Family education, Balance training, Stair training, Taping, Dry Needling, and Joint mobilization  PLAN FOR NEXT SESSION: Continue to progress within protocol as tolerated.Pt will be 10 weeks post-op as of 6/16/ 2025.    Albesa Huguenin, PT, DPT 03/21/2024, 12:49 PM  For all possible CPT codes, reference the Planned Interventions line above.     Check all conditions that are expected to impact treatment: {Conditions expected to impact treatment:None of these apply   If treatment provided at initial evaluation, no treatment charged due to lack of authorization.

## 2024-03-23 ENCOUNTER — Ambulatory Visit

## 2024-03-23 ENCOUNTER — Telehealth: Payer: Self-pay

## 2024-03-23 NOTE — Telephone Encounter (Signed)
 TC due to missed visit.  Spoke directly to patient who had forgotten appointment as her daughter was off from school.  Reminded of next visit date and time.

## 2024-03-27 ENCOUNTER — Ambulatory Visit: Admitting: Physical Therapy

## 2024-03-27 ENCOUNTER — Encounter: Payer: Self-pay | Admitting: Physical Therapy

## 2024-03-27 DIAGNOSIS — M7099 Unspecified soft tissue disorder related to use, overuse and pressure multiple sites: Secondary | ICD-10-CM

## 2024-03-27 DIAGNOSIS — M25571 Pain in right ankle and joints of right foot: Secondary | ICD-10-CM

## 2024-03-27 DIAGNOSIS — M25671 Stiffness of right ankle, not elsewhere classified: Secondary | ICD-10-CM

## 2024-03-27 NOTE — Therapy (Signed)
 OUTPATIENT PHYSICAL THERAPY TREATMENT   Patient Name: Terri Howard MRN: [id number] DOB:[birth year], 33 y.o., female Today's Date: 03/27/2024  END OF SESSION:  PT End of Session - 03/27/24 0921     Visit Number 9    Number of Visits 25    Date for PT Re-Evaluation 04/26/24    Authorization Type [place] [facility]    Authorization Time Period Approved 5 visits 03/13/24-05/11/24    Authorization - Visit Number 2    Authorization - Number of Visits 5    PT Start Time 0920    PT Stop Time 0958    PT Time Calculation (min) 38 min    Activity Tolerance Patient tolerated treatment well    Behavior During Therapy [facility] for tasks assessed/performed                Past Medical History:  Diagnosis Date   Asthma    Chlamydia    GBS carrier 08/23/2011   Screening for HIV (human immunodeficiency virus)    Urinary tract infection, recurrent    Past Surgical History:  Procedure Laterality Date   BREAST LUMPECTOMY  2000   fibroadenma   Patient Active Problem List   Diagnosis Date Noted   SVD (spontaneous vaginal delivery) 06/30/2020   Postpartum care following vaginal delivery 9/26 06/30/2020   Bipolar 2 disorder (HCC) 01/09/2019   Suicide attempt by drug ingestion (HCC)    Depression 11/07/2014   TCA (tricyclic antidepressant) overdose of undetermined intent 11/05/2014   chronic asthma  08/23/2011    PCP: not in system  REFERRING PROVIDER: Elsa Lonni SAUNDERS, MD  REFERRING DIAG: S/P RIGHT ACHILLE RECONSTRUCTION SURGERY 01/13/24 ,per patient   Rationale for Evaluation and Treatment: Rehabilitation  THERAPY DIAG:  Pain in right ankle and joints of right foot  Stiffness of right ankle, not elsewhere classified  Unspecified soft tissue disorder related to use, overuse and pressure multiple sites  PERTINENT HISTORY: Asthma (uses an inhaler)  WEIGHT BEARING RESTRICTIONS: Progressive WBAT   FALLS:  Has patient fallen in last 6 months? Yes. Number of falls 2, rolled over on a  toe while using crutches  LIVING ENVIRONMENT: Lives with: lives with their family Lives in: House/apartment Stairs: no stairs Has following equipment at home: Crutches, shower chair, and has an order for a knee scooter  OCCUPATION: mental health therapist LCSW   PRECAUTIONS: None ---------------------------------------------------------------------------------------------  SUBJECTIVE:   SUBJECTIVE STATEMENT:  Pt attended today's session with reports of 0/10 pain. Pt stated that they have maintained great compliance with current HEP.  Pt had f/u appt with surgeon and was told everything is healing well.      Eval statement 02/02/2024: tore achilles on April 3rd, had an ultrasound performed at a [place] urgent care. Surgery to reconstruct on April 10th at the [facility] surgical center. They gave her a packet with protocols and rehab information in it, currently not with her, its in her work bag.  RED FLAGS: None   PLOF: Independent  PATIENT GOALS: get back to martial arts, kung fu and boxing primarily   NEXT MD VISIT: June 11th ---------------------------------------------------------------------------------------------  OBJECTIVE:  Note: Objective measures were completed at Evaluation unless otherwise noted.  PATIENT SURVEYS:  LEFS 17/80  COGNITION: Overall cognitive status: Within functional limits for tasks assessed     SENSATION: Light touch: Impaired   EDEMA:  Minimal swelling around R ankle   POSTURE: No Significant postural limitations  PALPATION: Slight tenderness around surgical site   LOWER EXTREMITY ROM:  Passive  ROM Right eval Left eval  Hip flexion    Hip extension    Hip abduction    Hip adduction    Hip internal rotation    Hip external rotation    Knee flexion [facility]  Knee extension [facility]  Ankle dorsiflexion -15 WFL  Ankle plantarflexion [facility]  Ankle inversion    Ankle eversion     (Blank rows = not tested)  ! Indicates  pain with testing  LOWER EXTREMITY MMT: deferred d/t recent surgery  MMT Right eval Left eval  Hip flexion    Hip extension    Hip abduction    Hip adduction    Hip internal rotation    Hip external rotation    Knee flexion    Knee extension    Ankle dorsiflexion    Ankle plantarflexion    Ankle inversion    Ankle eversion     (Blank rows = not tested)  ! Indicates pain with testing  GAIT: Distance walked: 143ft Assistive device utilized: Crutches Level of assistance: Modified independence Comments: uses 3 point NWB pattern on RLE  [facility] Adult PT Treatment:                                                DATE: 03/27/2024  Therapeutic Activity:  Objective testing WFL ROM, Good strength (4+ in supine) SLS with trampoline ball toss 3x15, 500g ball Heel raise 2x12, hold 1s Gait training Treadmill training  for heel strike,arm swing, Push off facilitation. Hard floor training for heel strike and push off   [facility] Adult PT Treatment:                                                DATE: 03/20/2024 Therapeutic Activity: NuStep  8' for activity tolerance Standing gastroc stretch 2x1' for ROM and WB tolerance Standing soleus stretch using 6 box 2x1' for ROM and WB tolerance Standing lateral weight shifts 2x12, hold 5s in SLS  Gait training  Practice with heel > toe transition,  PATIENT EDUCATION:  Education details: Pt received education regarding HEP performance, ADL performance, functional activity tolerance, impairment education, appropriate performance of therapeutic activities.DME use, surgical protocol Person educated: Patient Education method: Explanation, Demonstration, Tactile cues, Verbal cues, and Handouts Education comprehension: verbalized understanding and returned demonstration  HOME EXERCISE PROGRAM: Access Code: [email or web address] URL: [email or web address] Date: 02/15/2024 Prepared by: Mabel Kiang  Exercises - Supine Ankle Circles  - 1 x  daily - 7 x weekly - 2 sets - 1 reps - 16m hold - Ankle Dorsiflexion with Resistance  - 1 x daily - 7 x weekly - 2-3 sets - 10-15 reps - 2s hold - Ankle Eversion with Resistance  - 1 x daily - 7 x weekly - 2-3 sets - 10-15 reps - 2s hold - Ankle Inversion with Resistance  - 1 x daily - 7 x weekly - 2-3 sets - 10-15 reps - 2s hold - Ankle and Toe Plantarflexion with Resistance  - 1 x daily - 7 x weekly - 2-3 sets - 10-15 reps - 2s hold - Long Sitting Calf Stretch with Strap  - 1 x daily - 7 x weekly - 2 sets - 1 reps -  2' hold ---------------------------------------------------------------------------------------------  ASSESSMENT:  CLINICAL IMPRESSION: Pt attended physical therapy session for continuation of treatment regarding R achilles repair. Today's treatment focused on improvement of  WB tolerance, achilles complex strength, objective testing to measure progress, and gait quality. Pt showed great tolerance to administered treatment with no adverse effects by the end of session. Skilled intervention was utilized via activity modification for pt tolerance with task completion, functional progression/regression promoting best outcomes inline with current rehab goals, as well as minimal verbal/tactile cuing alongside no physical assistance for safe and appropriate performance of today's activities. Continue with therapeutic focus on WB tolerance, global ankle strengthening/stability, and ambulation quality.    03/02/2024 Re-Eval: Pt attended physical therapy session for re-evaluation of RLE achilles. Pt has met  2 goals and continues to work towards 5 others. Pt continues to progress iwthin surgical protocol and is currently on track, next steps will involve improving WB tolerance and gait quality without the boot once cleared by the surgeon and updating HEP to begin transition to higher level activity . Pt required moderate verbal cuing as well as no physical assistance for safe and appropriate  performance of today's activities.  Education was given to continue applying ADL education from previous sessions as well as performing HEP as prescribed with freedom to progress as tolerated using previous education on modification and exercise dosage. Pt has displayed and verbalized competence regarding this education. Pt is 7 weeks post-op as of 03/02/2024.   Pt continues to require the intervention of skilled outpatient physical therapy to address the aforementioned deficits and progress towards a functional level in line with therapeutic goals.   OBJECTIVE IMPAIRMENTS: Abnormal gait, decreased activity tolerance, decreased balance, decreased coordination, decreased mobility, difficulty walking, decreased ROM, decreased strength, hypomobility, increased edema, impaired flexibility, and pain.   ACTIVITY LIMITATIONS: carrying, standing, squatting, stairs, transfers, toileting, self feeding, locomotion level, and caring for others  PARTICIPATION LIMITATIONS: meal prep, cleaning, driving, shopping, community activity, and occupation  PERSONAL FACTORS: Behavior pattern, Time since onset of injury/illness/exacerbation, and Transportation are also affecting patient's functional outcome.   REHAB POTENTIAL: Good  CLINICAL DECISION MAKING: Stable/uncomplicated  EVALUATION COMPLEXITY: Low   GOALS: Goals reviewed with patient? YES  SHORT TERM GOALS: Target date: 03/15/2024  Pt will be independent with administered HEP to demonstrate the competency necessary for long term managemnet of symptoms at home.  Baseline: Goal status: MET 03/02/2024   2.  Pt will be able to WB at least 90% with CAM boot through RLE to demonstrate improved WB ability necessary for household/community ambulation Baseline:  Goal status: MET 03/02/2024    LONG TERM GOALS: Target date: 04/26/2024  Pt. Will achieve a LEFS score of 60/80 as to demonstrate improvement in self-perceived functional ability with daily  activities.  Baseline: 17/80 Goal status: PROGRESSING 03/02/2024   2.  Pt will improve Global ankle/foot strength to a 4+/5 to demonstrate improvement in strength for quality of motion and activity performance.  Baseline:  Goal status: PROGRESSING 03/02/2024  3.   Pt will independently ambulate 1054ft with LRAD, no CAM boot, no LOB, and functional ROM to allow for heel-toe gait pattern as to demonstrate improved weightbearing tolerance, BLE strength, and functional capacity for community ambulation.  Baseline: NWB in CAM boot with B crutches Goal status: PROGRESSING 03/02/2024  4.  Pt will report the ability to stand for >/= 60 minutes as to demonstrate improved tolerance to standing for prolonged time and improved ability to participate in martial arts practice and take  care of children.  Baseline:  Goal status: PROGRESSING 03/02/2024  5.  Pt will report pain levels improving during ADLs to be less than or equal to 2/10 as to demonstrate improved tolerance with daily functional activities such as care taking and cleaning the house. Baseline:  Goal status: PROGRESSING 03/02/2024   --------------------------------------------------------------------------------------------- PLAN:  PT FREQUENCY: 1-2x/week  PT DURATION: 12 weeks  PLANNED INTERVENTIONS: 97110-Therapeutic exercises, 97530- Therapeutic activity, 97112- Neuromuscular re-education, 97535- Self Care, 02859- Manual therapy, [phone]- Gait training, [phone]- Electrical stimulation (manual), 97016- Vasopneumatic device, Patient/Family education, Balance training, Stair training, Taping, Dry Needling, and Joint mobilization  PLAN FOR NEXT SESSION: Continue with therapeutic focus on WB tolerance, global ankle strengthening/stability, and ambulation quality.   Mabel Kiang, PT, DPT 03/27/2024, 10:04 AM    For all possible CPT codes, reference the Planned Interventions line above.     Check all conditions that are expected to impact  treatment: {Conditions expected to impact treatment:None of these apply   If treatment provided at initial evaluation, no treatment charged due to lack of authorization.

## 2024-03-29 ENCOUNTER — Ambulatory Visit: Admitting: Physical Therapy

## 2024-04-03 ENCOUNTER — Ambulatory Visit

## 2024-04-03 DIAGNOSIS — M25571 Pain in right ankle and joints of right foot: Secondary | ICD-10-CM | POA: Diagnosis not present

## 2024-04-03 DIAGNOSIS — M25671 Stiffness of right ankle, not elsewhere classified: Secondary | ICD-10-CM

## 2024-04-03 DIAGNOSIS — M7099 Unspecified soft tissue disorder related to use, overuse and pressure multiple sites: Secondary | ICD-10-CM

## 2024-04-03 NOTE — Therapy (Signed)
 OUTPATIENT PHYSICAL THERAPY TREATMENT   Patient Name: Terri Howard MRN: [id number] DOB:06-Sep-1991, 33 y.o., female Today's Date: 04/03/2024  END OF SESSION:  PT End of Session - 04/03/24 0915     Visit Number 10    Number of Visits 25    Date for PT Re-Evaluation 04/26/24    Authorization Type [place] [employer]    Authorization Time Period Approved 5 visits 03/13/24-05/11/24    Authorization - Visit Number 4    Authorization - Number of Visits 5    PT Start Time 0915    PT Stop Time 0955    PT Time Calculation (min) 40 min    Activity Tolerance Patient tolerated treatment well    Behavior During Therapy [facility] for tasks assessed/performed         Past Medical History:  Diagnosis Date   Asthma    Chlamydia    GBS carrier 08/23/2011   Screening for HIV (human immunodeficiency virus)    Urinary tract infection, recurrent    Past Surgical History:  Procedure Laterality Date   BREAST LUMPECTOMY  2000   fibroadenma   Patient Active Problem List   Diagnosis Date Noted   SVD (spontaneous vaginal delivery) 06/30/2020   Postpartum care following vaginal delivery 9/26 06/30/2020   Bipolar 2 disorder (HCC) 01/09/2019   Suicide attempt by drug ingestion (HCC)    Depression 11/07/2014   TCA (tricyclic antidepressant) overdose of undetermined intent 11/05/2014   chronic asthma  08/23/2011    PCP: not in system  REFERRING PROVIDER: Elsa Lonni SAUNDERS, MD  REFERRING DIAG: S/P RIGHT ACHILLE RECONSTRUCTION SURGERY 01/13/24 ,per patient   Rationale for Evaluation and Treatment: Rehabilitation  THERAPY DIAG:  Pain in right ankle and joints of right foot  Stiffness of right ankle, not elsewhere classified  Unspecified soft tissue disorder related to use, overuse and pressure multiple sites  PERTINENT HISTORY: Asthma (uses an inhaler)  WEIGHT BEARING RESTRICTIONS: Progressive WBAT   FALLS:  Has patient fallen in last 6 months? Yes. Number of falls 2, rolled over on a toe while  using crutches  LIVING ENVIRONMENT: Lives with: lives with their family Lives in: House/apartment Stairs: no stairs Has following equipment at home: Crutches, shower chair, and has an order for a knee scooter  OCCUPATION: mental health therapist LCSW   PRECAUTIONS: None ---------------------------------------------------------------------------------------------  SUBJECTIVE:   SUBJECTIVE STATEMENT: Arrives to PT with no pain.  Describes some stiffness in AM which resolves with heat and activity.   Eval statement 02/02/2024: tore achilles on April 3rd, had an ultrasound performed at a [place] urgent care. Surgery to reconstruct on April 10th at the [facility] surgical center. They gave her a packet with protocols and rehab information in it, currently not with her, its in her work bag.  RED FLAGS: None   PLOF: Independent  PATIENT GOALS: get back to martial arts, kung fu and boxing primarily   NEXT MD VISIT: June 11th ---------------------------------------------------------------------------------------------  OBJECTIVE:  Note: Objective measures were completed at Evaluation unless otherwise noted.  PATIENT SURVEYS:  LEFS 17/80  COGNITION: Overall cognitive status: Within functional limits for tasks assessed     SENSATION: Light touch: Impaired   EDEMA:  Minimal swelling around R ankle   POSTURE: No Significant postural limitations  PALPATION: Slight tenderness around surgical site   LOWER EXTREMITY ROM:  Passive ROM Right eval Left eval  Hip flexion    Hip extension    Hip abduction    Hip adduction    Hip  internal rotation    Hip external rotation    Knee flexion [facility]  Knee extension [facility]  Ankle dorsiflexion -15 WFL  Ankle plantarflexion [facility]  Ankle inversion    Ankle eversion     (Blank rows = not tested)  ! Indicates pain with testing  LOWER EXTREMITY MMT: deferred d/t recent surgery  MMT Right eval Left eval  Hip flexion     Hip extension    Hip abduction    Hip adduction    Hip internal rotation    Hip external rotation    Knee flexion    Knee extension    Ankle dorsiflexion    Ankle plantarflexion    Ankle inversion    Ankle eversion     (Blank rows = not tested)  ! Indicates pain with testing  GAIT: Distance walked: 164ft Assistive device utilized: Crutches Level of assistance: Modified independence Comments: uses 3 point NWB pattern on RLE  [facility] Adult PT Treatment:                                                DATE: 04/03/24 Therapeutic Exercise: Nustep L4 8 min focus on ROM at ankle Neuromuscular re-ed: Runners step 4 in 15/15 Heel raises over 4 in step Therapeutic Activity: STS from airex pad 10x w/o UE Cable walks 5x 10# 4 way  [facility] Adult PT Treatment:                                                DATE: 03/27/2024  Therapeutic Activity:  Objective testing WFL ROM, Good strength (4+ in supine) SLS with trampoline ball toss 3x15, 500g ball Heel raise 2x12, hold 1s Gait training Treadmill training  for heel strike,arm swing, Push off facilitation. Hard floor training for heel strike and push off   [facility] Adult PT Treatment:                                                DATE: 03/20/2024 Therapeutic Activity: NuStep  8' for activity tolerance Standing gastroc stretch 2x1' for ROM and WB tolerance Standing soleus stretch using 6 box 2x1' for ROM and WB tolerance Standing lateral weight shifts 2x12, hold 5s in SLS  Gait training  Practice with heel > toe transition,  PATIENT EDUCATION:  Education details: Pt received education regarding HEP performance, ADL performance, functional activity tolerance, impairment education, appropriate performance of therapeutic activities.DME use, surgical protocol Person educated: Patient Education method: Explanation, Demonstration, Tactile cues, Verbal cues, and Handouts Education comprehension: verbalized understanding and returned  demonstration  HOME EXERCISE PROGRAM: Access Code: [email or web address] URL: [email or web address] Date: 02/15/2024 Prepared by: Mabel Kiang  Exercises - Supine Ankle Circles  - 1 x daily - 7 x weekly - 2 sets - 1 reps - 72m hold - Ankle Dorsiflexion with Resistance  - 1 x daily - 7 x weekly - 2-3 sets - 10-15 reps - 2s hold - Ankle Eversion with Resistance  - 1 x daily - 7 x weekly - 2-3 sets - 10-15 reps - 2s hold - Ankle Inversion with Resistance  -  1 x daily - 7 x weekly - 2-3 sets - 10-15 reps - 2s hold - Ankle and Toe Plantarflexion with Resistance  - 1 x daily - 7 x weekly - 2-3 sets - 10-15 reps - 2s hold - Long Sitting Calf Stretch with Strap  - 1 x daily - 7 x weekly - 2 sets - 1 reps - 2' hold ---------------------------------------------------------------------------------------------  ASSESSMENT:  CLINICAL IMPRESSION: Added stretching tasks to promote DF and focused on proprioceptive activity as well as SLS tasks.  Added cable walks for balance and eccentric control as well as confidence boosting.  Instructed to focus on stretch at home.     03/02/2024 Re-Eval: Pt attended physical therapy session for re-evaluation of RLE achilles. Pt has met  2 goals and continues to work towards 5 others. Pt continues to progress iwthin surgical protocol and is currently on track, next steps will involve improving WB tolerance and gait quality without the boot once cleared by the surgeon and updating HEP to begin transition to higher level activity . Pt required moderate verbal cuing as well as no physical assistance for safe and appropriate performance of today's activities.  Education was given to continue applying ADL education from previous sessions as well as performing HEP as prescribed with freedom to progress as tolerated using previous education on modification and exercise dosage. Pt has displayed and verbalized competence regarding this education. Pt is 7 weeks post-op as of  03/02/2024.   Pt continues to require the intervention of skilled outpatient physical therapy to address the aforementioned deficits and progress towards a functional level in line with therapeutic goals.   OBJECTIVE IMPAIRMENTS: Abnormal gait, decreased activity tolerance, decreased balance, decreased coordination, decreased mobility, difficulty walking, decreased ROM, decreased strength, hypomobility, increased edema, impaired flexibility, and pain.   ACTIVITY LIMITATIONS: carrying, standing, squatting, stairs, transfers, toileting, self feeding, locomotion level, and caring for others  PARTICIPATION LIMITATIONS: meal prep, cleaning, driving, shopping, community activity, and occupation  PERSONAL FACTORS: Behavior pattern, Time since onset of injury/illness/exacerbation, and Transportation are also affecting patient's functional outcome.   REHAB POTENTIAL: Good  CLINICAL DECISION MAKING: Stable/uncomplicated  EVALUATION COMPLEXITY: Low   GOALS: Goals reviewed with patient? YES  SHORT TERM GOALS: Target date: 03/15/2024  Pt will be independent with administered HEP to demonstrate the competency necessary for long term managemnet of symptoms at home.  Baseline: Goal status: MET 03/02/2024   2.  Pt will be able to WB at least 90% with CAM boot through RLE to demonstrate improved WB ability necessary for household/community ambulation Baseline:  Goal status: MET 03/02/2024    LONG TERM GOALS: Target date: 04/26/2024  Pt. Will achieve a LEFS score of 60/80 as to demonstrate improvement in self-perceived functional ability with daily activities.  Baseline: 17/80 Goal status: PROGRESSING 03/02/2024   2.  Pt will improve Global ankle/foot strength to a 4+/5 to demonstrate improvement in strength for quality of motion and activity performance.  Baseline:  Goal status: PROGRESSING 03/02/2024  3.   Pt will independently ambulate 1076ft with LRAD, no CAM boot, no LOB, and functional ROM  to allow for heel-toe gait pattern as to demonstrate improved weightbearing tolerance, BLE strength, and functional capacity for community ambulation.  Baseline: NWB in CAM boot with B crutches Goal status: PROGRESSING 03/02/2024  4.  Pt will report the ability to stand for >/= 60 minutes as to demonstrate improved tolerance to standing for prolonged time and improved ability to participate in martial arts practice and take  care of children.  Baseline:  Goal status: PROGRESSING 03/02/2024  5.  Pt will report pain levels improving during ADLs to be less than or equal to 2/10 as to demonstrate improved tolerance with daily functional activities such as care taking and cleaning the house. Baseline:  Goal status: PROGRESSING 03/02/2024   --------------------------------------------------------------------------------------------- PLAN:  PT FREQUENCY: 1-2x/week  PT DURATION: 12 weeks  PLANNED INTERVENTIONS: 97110-Therapeutic exercises, 97530- Therapeutic activity, 97112- Neuromuscular re-education, 97535- Self Care, [id number]- Manual therapy, [phone]- Gait training, [phone]- Electrical stimulation (manual), 97016- Vasopneumatic device, Patient/Family education, Balance training, Stair training, Taping, Dry Needling, and Joint mobilization  PLAN FOR NEXT SESSION: Continue with therapeutic focus on WB tolerance, global ankle strengthening/stability, and ambulation quality.   Jeff Fama [place]ow PT  04/03/2024, 10:01 AM    For all possible CPT codes, reference the Planned Interventions line above.     Check all conditions that are expected to impact treatment: {Conditions expected to impact treatment:None of these apply   If treatment provided at initial evaluation, no treatment charged due to lack of authorization.

## 2024-04-05 ENCOUNTER — Ambulatory Visit: Attending: Orthopaedic Surgery | Admitting: Physical Therapy

## 2024-11-07 ENCOUNTER — Emergency Department (HOSPITAL_COMMUNITY)
Admission: EM | Admit: 2024-11-07 | Discharge: 2024-11-07 | Disposition: A | Discharge status: Home / Self Care | Attending: Emergency Medicine | Admitting: Emergency Medicine

## 2024-11-07 ENCOUNTER — Emergency Department (HOSPITAL_COMMUNITY)

## 2024-11-07 ENCOUNTER — Other Ambulatory Visit: Payer: Self-pay

## 2024-11-07 DIAGNOSIS — J4541 Moderate persistent asthma with (acute) exacerbation: Secondary | ICD-10-CM | POA: Insufficient documentation

## 2024-11-07 DIAGNOSIS — J069 Acute upper respiratory infection, unspecified: Secondary | ICD-10-CM | POA: Insufficient documentation

## 2024-11-07 LAB — RESP PANEL BY RT-PCR (RSV, FLU A&B, COVID)  RVPGX2
Influenza A by PCR: NEGATIVE
Influenza B by PCR: NEGATIVE
Resp Syncytial Virus by PCR: NEGATIVE
SARS Coronavirus 2 by RT PCR: NEGATIVE

## 2024-11-07 MED ORDER — PREDNISONE 20 MG PO TABS: ORAL_TABLET | ORAL | 0 refills | Status: AC

## 2024-11-07 MED ORDER — IBUPROFEN 400 MG PO TABS
600.0000 mg | ORAL_TABLET | Freq: Once | ORAL | Status: AC
  Administered 2024-11-07: 600 mg via ORAL
  Filled 2024-11-07: qty 1

## 2024-11-07 MED ORDER — IPRATROPIUM-ALBUTEROL 0.5-2.5 (3) MG/3ML IN SOLN
3.0000 mL | Freq: Once | RESPIRATORY_TRACT | Status: AC
  Administered 2024-11-07: 3 mL via RESPIRATORY_TRACT
  Filled 2024-11-07: qty 3

## 2024-11-07 MED ORDER — PREDNISONE 20 MG PO TABS
60.0000 mg | ORAL_TABLET | Freq: Once | ORAL | Status: AC
  Administered 2024-11-07: 60 mg via ORAL
  Filled 2024-11-07: qty 3

## 2024-11-07 NOTE — ED Triage Notes (Signed)
 Pt. Stated, I started having a cough 3 days ago and have asthma and my inhaler is not working. Ive had SOB and wheezing. My Dr. Odette me to come here.

## 2024-11-07 NOTE — ED Notes (Signed)
 Pt. Stated that she feels much better after nebulizer treatment
